# Patient Record
Sex: Female | Born: 1980 | Hispanic: Yes | Marital: Single | Smoking: Never smoker
Health system: Southern US, Community
[De-identification: ages and names within clinical notes are randomized; demographics above are authoritative.]

## PROBLEM LIST (undated history)

## (undated) DIAGNOSIS — E119 Type 2 diabetes mellitus without complications: Secondary | ICD-10-CM

## (undated) HISTORY — DX: Type 2 diabetes mellitus without complications: E11.9

---

## 2012-06-07 HISTORY — PX: CARPAL TUNNEL RELEASE: SHX101

## 2013-06-07 NOTE — L&D Delivery Note (Signed)
Delivery Note CNM called for delivery (phone time 10:03); upon arrival, infant on pt's abd and cord being cut. At 10:03 PM a viable female was delivered via Vaginal, Spontaneous Delivery (Presentation: Left Occiput Anterior).  APGAR: 9, 9; weight: pending. Bladder straight cathed due to pt's desire to urinate just prior to delivery- urine quantity sufficient.  Placenta status: Intact, Spontaneous.  Cord: 3 vessels with the following complications: None.  Fundus firm with massage.  Anesthesia: Epidural  Episiotomy: None Lacerations: None Est. Blood Loss (mL): 300  Mom to postpartum.  Baby to Couplet care / Skin to Skin.  Cam HaiSHAW, Oyuki Hogan CNM 12/09/2013, 10:50 PM

## 2013-06-07 NOTE — L&D Delivery Note (Signed)
Attestation of Attending Supervision of Advanced Practitioner (CNM/NP): Evaluation and management procedures were performed by the Advanced Practitioner under my supervision and collaboration.  I have reviewed the Advanced Practitioner's note and chart, and I agree with the management and plan.  Demere Dotzler 12/13/2013 2:29 PM   

## 2013-07-02 LAB — OB RESULTS CONSOLE GC/CHLAMYDIA
Chlamydia: NEGATIVE
Gonorrhea: NEGATIVE

## 2013-07-02 LAB — OB RESULTS CONSOLE RPR: RPR: NONREACTIVE

## 2013-07-02 LAB — OB RESULTS CONSOLE ABO/RH: RH Type: POSITIVE

## 2013-07-02 LAB — OB RESULTS CONSOLE ANTIBODY SCREEN: Antibody Screen: NEGATIVE

## 2013-07-02 LAB — OB RESULTS CONSOLE VARICELLA ZOSTER ANTIBODY, IGG: Varicella: IMMUNE

## 2013-07-02 LAB — OB RESULTS CONSOLE PLATELET COUNT: PLATELETS: 294 10*3/uL

## 2013-07-02 LAB — OB RESULTS CONSOLE HGB/HCT, BLOOD
HCT: 40 %
Hemoglobin: 13.1 g/dL

## 2013-07-02 LAB — GLUCOSE TOLERANCE, 1 HOUR: GLUCOSE 1 HOUR GTT: 195

## 2013-07-09 ENCOUNTER — Encounter: Payer: Self-pay | Admitting: *Deleted

## 2013-07-23 ENCOUNTER — Ambulatory Visit (INDEPENDENT_AMBULATORY_CARE_PROVIDER_SITE_OTHER): Payer: Self-pay | Admitting: Obstetrics & Gynecology

## 2013-07-23 ENCOUNTER — Encounter: Payer: Medicaid Other | Attending: Family Medicine | Admitting: *Deleted

## 2013-07-23 VITALS — BP 123/77 | Temp 98.2°F | Ht 60.5 in | Wt 160.5 lb

## 2013-07-23 DIAGNOSIS — O24419 Gestational diabetes mellitus in pregnancy, unspecified control: Secondary | ICD-10-CM | POA: Insufficient documentation

## 2013-07-23 DIAGNOSIS — O99810 Abnormal glucose complicating pregnancy: Secondary | ICD-10-CM

## 2013-07-23 LAB — POCT URINALYSIS DIP (DEVICE)
Bilirubin Urine: NEGATIVE
GLUCOSE, UA: NEGATIVE mg/dL
Hgb urine dipstick: NEGATIVE
Leukocytes, UA: NEGATIVE
Nitrite: NEGATIVE
PH: 7 (ref 5.0–8.0)
Protein, ur: NEGATIVE mg/dL
Specific Gravity, Urine: 1.01 (ref 1.005–1.030)
Urobilinogen, UA: 0.2 mg/dL (ref 0.0–1.0)

## 2013-07-23 NOTE — Progress Notes (Signed)
  Patient was seen for Gestational Diabetes self-management .    States the definition of Gestational Diabetes  States when to check blood glucose levels  Demonstrates proper blood glucose monitoring techniques  States the effect of stress and exercise on blood glucose levels  Consider  increasing your activity level by walking daily as tolerated Begin checking BG before breakfast and 2 hours after first bit of breakfast, lunch and dinner after  as directed by MD   Patient instructed to monitor glucose levels: FBS: 60 - <90 2 hour: <120  Patient received the following handouts:  Nutrition Diabetes and Pregnancy  Patient will be seen for follow-up as needed.

## 2013-07-23 NOTE — Progress Notes (Signed)
Pulse: 78 Patient failed 1 hr gtt with 195 result. Today she will see Diabetes Ed, Nutrition and SW. She will return in one week for MD appt.

## 2013-07-23 NOTE — Progress Notes (Signed)
Nutrition note: 1st visit consult & GDM diet education Pt has h/o obesity.  Pt has lost 4.5# @ 19w. Pt is unsure of reason for wt loss. Pt reports eating 3 meals & 3 snacks/d. Pt is taking PNV. Pt reports no N/V or heartburn. Pt received verbal & written education in Spanish about GDM diet. Encouraged protein with all meals & snacks. Discussed wt gain goals of 11-20# or 0.5#/wk. Pt agrees to follow GDM diet with 3 meals & 3 snacks/d & proper CHO/ protein combination. Pt has WIC & plans to BF. F/u in 2-4 wks Blondell RevealLaura Akhilesh Sassone, MS, RD, LDN, [facility]

## 2013-07-26 LAB — CULTURE, OB URINE

## 2013-07-27 LAB — PRESCRIPTION MONITORING PROFILE (19 PANEL)
Amphetamine/Meth: NEGATIVE ng/mL
BUPRENORPHINE, URINE: NEGATIVE ng/mL
Barbiturate Screen, Urine: NEGATIVE ng/mL
Benzodiazepine Screen, Urine: NEGATIVE ng/mL
COCAINE METABOLITES: NEGATIVE ng/mL
Cannabinoid Scrn, Ur: NEGATIVE ng/mL
Carisoprodol, Urine: NEGATIVE ng/mL
Creatinine, Urine: 57.24 mg/dL (ref >20.0)
FENTANYL URINE: NEGATIVE ng/mL
MDMA URINE: NEGATIVE ng/mL
Meperidine, Ur: NEGATIVE ng/mL
Methadone Screen, Urine: NEGATIVE ng/mL
Methaqualone: NEGATIVE ng/mL
Nitrites, Initial: NEGATIVE ug/mL
Opiate Screen, Urine: NEGATIVE ng/mL
Oxycodone Screen, Ur: NEGATIVE ng/mL
Phencyclidine, Ur: NEGATIVE ng/mL
Propoxyphene: NEGATIVE ng/mL
Tapentadol, urine: NEGATIVE ng/mL
Tramadol Scrn, Ur: NEGATIVE ng/mL
ZOLPIDEM, URINE: NEGATIVE ng/mL
pH, Initial: 7.2 pH (ref 4.5–8.9)

## 2013-07-30 ENCOUNTER — Ambulatory Visit (INDEPENDENT_AMBULATORY_CARE_PROVIDER_SITE_OTHER): Payer: Self-pay | Admitting: Family Medicine

## 2013-07-30 ENCOUNTER — Ambulatory Visit (INDEPENDENT_AMBULATORY_CARE_PROVIDER_SITE_OTHER): Payer: Self-pay | Admitting: Home Health Services

## 2013-07-30 ENCOUNTER — Encounter: Payer: Self-pay | Admitting: Family Medicine

## 2013-07-30 VITALS — BP 136/85 | Temp 97.6°F | Wt 158.9 lb

## 2013-07-30 DIAGNOSIS — O09899 Supervision of other high risk pregnancies, unspecified trimester: Secondary | ICD-10-CM | POA: Insufficient documentation

## 2013-07-30 DIAGNOSIS — O9989 Other specified diseases and conditions complicating pregnancy, childbirth and the puerperium: Secondary | ICD-10-CM

## 2013-07-30 DIAGNOSIS — O99810 Abnormal glucose complicating pregnancy: Secondary | ICD-10-CM

## 2013-07-30 DIAGNOSIS — O24419 Gestational diabetes mellitus in pregnancy, unspecified control: Secondary | ICD-10-CM

## 2013-07-30 DIAGNOSIS — Z283 Underimmunization status: Secondary | ICD-10-CM

## 2013-07-30 DIAGNOSIS — Z2839 Other underimmunization status: Secondary | ICD-10-CM

## 2013-07-30 LAB — POCT URINALYSIS DIP (DEVICE)
Bilirubin Urine: NEGATIVE
Glucose, UA: NEGATIVE mg/dL
HGB URINE DIPSTICK: NEGATIVE
KETONES UR: 15 mg/dL — AB
Leukocytes, UA: NEGATIVE
Nitrite: NEGATIVE
PROTEIN: NEGATIVE mg/dL
Specific Gravity, Urine: 1.01 (ref 1.005–1.030)
UROBILINOGEN UA: 1 mg/dL (ref 0.0–1.0)
pH: 7 (ref 5.0–8.0)

## 2013-07-30 MED ORDER — ASPIRIN 81 MG PO CHEW: 81.0000 mg | CHEWABLE_TABLET | Freq: Every day | ORAL | Status: DC

## 2013-07-30 NOTE — Progress Notes (Signed)
[facility] 1 hour 195 at 16 wks, begin baby ASA Had quad there-need results FBS 76-103 (3 out of range) 2 hour pp 77-126 (1 out of range Needs baseline labs and 24 hr urine.  Anatomy u/s fetal ECHO and optho Begin baby ASA

## 2013-07-30 NOTE — Progress Notes (Signed)
Pulse- 75 Pt reports still not having any baby mvmt New ob packet given Flu vaccine give @ [facility]

## 2013-07-30 NOTE — Patient Instructions (Addendum)
A raz de una dieta [place] y Radio producer nivel de azcar en la sangre bajo control es la cosa ms importante que hacer para su salud y la de su beb por Psychologist, clinical.  Por favor verifique su nivel de azcar en la sangre 4 veces al da.  Por favor, mantenga registros BS precisos y llevarlos con usted a cada visita. Por favor traiga su medidor tambin.  Las metas para el azcar en sangre deben ser: 1. El ayuno (a primera hora de la maana antes de comer) debe ser inferior a 90. 2. 2 horas despus de las comidas deben ser inferiores a 120.  Por favor, comer 3 comidas y 3 meriendas. Incluya protenas (carne, lcteos, queso, huevos, frutos secos) con todas las comidas.  Tenga en cuenta que los carbohidratos aumentan el nivel de azcar en la sangre. No slo la comida dulce (galletas, pasteles, rosquillas, fruta, jugo, soda), sino tambin el pan, pasta, arroz y patatas. Usted tiene que limitar la cantidad de carbohidratos que est comiendo.  Adicin de ejercicio, tan slo 30 minutos al da puede disminuir el nivel de Banker.   Diabetes mellitus gestacional  (Gestational Diabetes Mellitus) La diabetes mellitus gestacional, ms comnmente conocida como diabetes gestacional es un tipo de diabetes que desarrollan algunas mujeres durante el [place]. En la diabetes gestacional, el pncreas no produce suficiente insulina (una hormona), las clulas son menos sensibles a la insulina que se produce (resistencia a la insulina), o ambos.Normalmente, la Johnson [employer] azcares de los alimentos a las clulas de los tejidos. Las clulas de los tejidos Cendant Corporation azcares para Psychiatrist. La falta de insulina o la falta de una respuesta normal a la insulina hace que el exceso de azcar se acumule en la sangre en lugar de Customer service manager en las clulas de los tejidos. Como resultado, se desarrollan los niveles altos de Banker (hiperglucemia). El [address] de los niveles altos de azcar (glucosa)  puede causar muchas complicaciones.  FACTORES DE RIESGO Usted tiene mayor probabilidad de desarrollar diabetes gestacional si tiene antecedentes familiares de diabetes y tambin si tiene uno o ms de los siguientes factores de riesgo:   ndice de masa corporal superior a 30 (obesidad).  Embarazo previo con diabetes gestacional.  Mayor edad en el momento del [place]. Si se mantienen los niveles de Event organiser en un rango normal durante el [place], las mujeres pueden tener un embarazo saludable. Si no controla bien sus niveles de glucosa en sangre, pueden tener tener riesgos usted, su beb antes de nacer (feto), el [place] de parto y [place], o el beb recin nacido.  SNTOMAS  Si se presentan sntomas, stos son similares a los sntomas que normalmente experimentar durante el embarazo. Los sntomas de la diabetes gestacional son:   Wyvonnia Dusky de la sed (polidipsia).  Aumento de ganas de Geographical information systems officer (poliuria).  Aumento de ganas de orinar durante la noche (nicturia).  Prdida de peso. Prdida de Celanese Corporation puede ser muy rpida.  Infecciones frecuentes y recurrentes.  Cansancio (fatiga).  Debilidad.  Cambios en la visin, como visin borrosa.  Olor a Water quality scientist.  Dolor abdominal. DIAGNSTICO La diabetes se diagnostica cuando hay aumento de los niveles de glucosa en la [place]. El nivel de glucosa en la sangre puede controlarse en uno o ms de los siguientes anlisis de sangre:   Medicin de glucosa en sangre en ayunas. No deber comer durante al menos 8 horas antes de que se tome la [place]  de [place].  Anlisis al azar de glucosa en sangre. El nivel de glucosa en sangre se controla en cualquier momento del da sin importar el momento en que haya comido.  Pruebas de glucosa de sangre de hemoglobina A1c. Un anlisis de la hemoglobina A1c proporciona informacin sobre el control de la glucosa en la sangre durante los ltimos 3 meses.  Prueba oral de tolerancia a la glucosa  (SOG). La glucosa en la sangre se mide despus de no haber comido (ayuno) durante 1-3 horas y despus de beber una bebida que contiene glucosa. Dado que las hormonas que causan la resistencia a la insulina son ms altas alrededor de las semanas 24-28 de [place], generalmente se realiza un SOG durante ese [place]. Si tiene factores de riesgo para la diabetes gestacional, su mdico puede detectarla antes de las 24 semanas de [place]. TRATAMIENTO   Usted tendr que tomar medicamentos para la diabetes o insulina diariamente para Pharmacologist los niveles de glucosa en sangre en el rango deseado.  Usted tendr Gap Inc coincidir la dosis de insulina con el ejercicio y Soil scientist de alimentos saludables. El objetivo del tratamiento es mantener el nivel de glucosa en sangre en 60-99 mg / dl antes de la comida (preprandial), a la hora de acostarse y Fiserv noche mientras dure su Psychiatrist. El objetivo del tratamiento es mantener el mayor pico de International aid/development worker en sangre despus de la comida (glucosa postprandial) en 100-140 mg/dl.  INSTRUCCIONES PARA EL CUIDADO EN EL HOGAR   Haga que su nivel de hemoglobina A1c sea verificado dos veces al ao.  Realice un control diario de glucosa en sangre segn las indicaciones de su mdico. Es comn realizar controles con frecuencia de la glucosa en sangre.  Supervise las cetonas en la orina cuando est enferma y segn las indicaciones de su mdico.  Tome su medicamento para la diabetes y la insulina segn las indicaciones de su mdico para Pharmacologist el nivel de glucosa en sangre en su rango deseado.  Nunca se quede sin medicamentos para la diabetes o sin insulina. Es necesario recibirla [employer].  Ajuste la Jacobs Engineering base de la ingesta de hidratos de carbono. Los hidratos de carbono pueden aumentar los niveles de glucosa en la sangre, pero deben incluirse en su dieta. Los hidratos de carbono aportan vitaminas, minerales y [place] que son Neomia Dear parte esencial de una  dieta saludable. Los hidratos de carbono se encuentran en frutas, verduras, granos enteros, productos lcteos, legumbres y alimentos que contienen azcares aadidos.    Consuma alimentos saludables. Alterne 3 comidas con 3 colaciones.  Mantenga un aumento de peso saludable. El aumento del peso total vara de acuerdo con el ndice de masa corporal antes del embarazo [facility]).  Lleve una tarjeta de alerta mdica o lleve una pulsera de alerta mdica.  Lleve consigo un refrigerio de 15 gramos de hidrato de carbonos en todo momento para Chief Operating Officer los niveles bajos de glucosa en sangre (hipoglucemia). Algunos ejemplos de aperitivos de 15 gramos de hidratos de carbono son:  Tabletas de glucosa, 3 o 4   Gel de glucosa, tubo de 15 gramos  Pasas de uva, 2 cucharadas (24 g)  Caramelos de goma, 6  Galletas de [place], 8  Jugo de fruta, soda regular, o leche baja en grasa, 4 onzas (120 ml)  Pastillas gomitas, 9    Reconocer la hipoglucemia. Durante el embarazo la hipoglucemia se produce cuando hay niveles de glucosa en sangre de 60 mg/dl o menos. El [place]  de hipoglucemia aumenta durante el ayuno o saltarse las comidas, [address] o despus del ejercicio intenso, y [place] sueo. Los sntomas de hipoglucemia son:  Temblores o sacudidas.  Disminucin de capacidad de concentracin.  Sudoracin.  Aumento en el ritmo cardaco  Dolor de [place].  [employer].  Hambre.  Irritabilidad.  Ansiedad.  Sueo agitado.  Alteracin del habla o de la coordinacin.  Confusin.  Tratar la hipoglucemia rpidamente. Si usted est alerta y puede tragar con seguridad, siga la regla de 15:15:  Tome de 15 a 20 gramos de glucosa de accin rpida o hidratos de carbono . Las opciones de accin rpida son un gel de glucosa, unas tabletas de glucosa, o 4 onzas (120 ml) de jugo de frutas, soda regular, o leche baja en grasa.  Compruebe su nivel de glucosa en sangre 15 minutos despus de tomar la  glucosa.   Tome de 15 a 20 gramos ms de glucosa si al repetir el nivel de glucosa en la sangre todava es de 70 mg / dl o inferior.  Coma una comida o una colacin dentro de 1 hora una vez que los niveles de glucosa en la sangre vuelven a la normalidad.  Est alerta a la poliuria y polidipsia, que son los primeros signos de hiperglucemia. El conocimiento temprano de la hiperglucemia permite un tratamiento oportuno. Controle la hiperglucemia segn le indic su mdico.  Haga actividad fsica por lo menos 30 minutos al da o como lo indique su mdico. Se recomienda diez minutos de actividad fsica cronometrados 30 minutos despus de cada comida para Chief Operating Officer los niveles de glucosa en sangre postprandial.  Ajuste su dosis de insulina y la ingesta de alimentos, segn sea necesario, si se inicia un nuevo ejercicio o deporte.  Siga su plan diario de enfermo en algn momento que no puede comer o beber como de costumbre.  Evite el tabaco y el alcohol.  Concurra regularmente a las visitas de control con el mdico.  Siga el consejo de su mdico respecto a los controles prenatales y posteriores al parto (post-parto), a la planificacin de las comidas, al ejercicio, a los [address], a las vitaminas, a los anlisis de [place], a otras pruebas mdicas y fsicas.  Cuide diariamente la piel y los pies. Examine su piel y los pies diariamente para detectar cortes, moretones, enrojecimiento, problemas en las uas, sangrado, ampollas o llagas.  Cepllese los dientes y encas por lo menos dos veces al da y use hilo dental al menos una vez por da. Concurra regularmente a las visitas de control con el dentista.  Programe un examen de vista durante el primer trimestre de su embarazo o como lo indique su mdico.  Comparta su plan de control de diabetes en su trabajo o en la escuela.  Mantngase al da con las vacunas.  Aprenda a Dealer.  Obtenga educacin continuada y ayuda para la diabetes  cuando sea necesario. SOLICITE ATENCIN MDICA SI:   No puede comer alimentos o beber por ms de 6 horas.  Tiene nuseas o ha vomitado durante ms de 6 horas.  Tiene un nivel de glucosa en sangre de 200 mg/dl y [employer].  Presenta algn cambio en el estado mental.  Tiene problemas de visin.  Sufre un dolor persistente de Training and development officer.  Tiene dolor o molestias en el abdomen.  Desarrolla una enfermedad grave adicional.  Tiene diarrea durante ms de 6 horas.  Ha estado enferma o ha tenido fiebre durante un par [employer]  y no mejora. SOLICITE ATENCIN MDICA DE INMEDIATO SI:   Tiene dificultad para respirar.  Ya no siente los movimientos del beb.  Est sangrando o tiene flujo vaginal.  Comienza a tener contracciones o trabajo de [place]parto prematuro. ASEGRESE DE QUE:  Comprende estas instrucciones.  Controlar su enfermedad.  Solicitar ayuda de inmediato si no mejora o si empeora. Document Released: 03/03/2005 Document Revised: 02/16/2012 [facility] Patient Information 2014 Republican CityExitCare, MarylandLLC.  Lactancia materna (Breastfeeding) Decidir Museum/gallery exhibitions officeramamantar es una de las mejores elecciones que puede hacer por usted y su beb. El cambio hormonal durante el Psychiatristembarazo produce el desarrollo del tejido mamario y Lesothoaumenta la cantidad y el tamao de los conductos galactforos. Estas hormonas tambin permiten que las protenas, los azcares y las grasas de la sangre produzcan la WPS Resourcesleche materna en las glndulas productoras de Slaterleche. Las hormonas impiden que la leche materna sea liberada antes del nacimiento del beb, adems de impulsar el flujo de leche luego del nacimiento. Una vez que ha comenzado a Museum/gallery exhibitions officeramamantar, Conservation officer, naturepensar en el beb, as Immunologistcomo la succin o Theatre managerel llanto, pueden estimular la liberacin de Powhatanleche de las glndulas productoras de [place]leche.  LOS BENEFICIOS DE AMAMANTAR Para el beb  La primera leche (calostro) ayuda al mejor funcionamiento del sistema digestivo del beb.  La leche tiene anticuerpos  que ayudan a Radio producerprevenir las infecciones en el beb.  El beb tiene una menor incidencia de asma, alergias y del sndrome de muerte sbita del lactante.  Los nutrientes en la Deweyvilleleche materna son mejores para el beb que la Snellingleche maternizada y estn preparados exclusivamente para cubrir las necesidades del beb.  La leche materna mejora el desarrollo cerebral del beb.  Es menos probable que el beb desarrolle otras enfermedades, como obesidad infantil, asma o diabetes mellitus de tipo 2. Para usted   La lactancia materna favorece el desarrollo de un vnculo muy especial entre la madre y el beb.  Es conveniente. Siempre est disponible a la temperatura correcta y es Plymoutheconmica.  La lactancia materna ayuda a quemar caloras y a perder el peso ganado durante el Crookembarazo.  Favorece la contraccin del tero al tamao que tena antes del embarazo de manera ms rpida y disminuye el sangrado (loquios) despus del parto.  La lactancia materna contribuye a reducir Nurse, adultel riesgo de desarrollar diabetes mellitus de tipo 2, osteoporosis o cncer de mama o de ovario en el futuro. SIGNOS DE QUE EL BEB EST HAMBRIENTO Primeros signos de [address]  Aumenta su estado de Lesothoalerta o actividad.  Se estira.  Mueve la cabeza de un lado a otro.  Mueve la cabeza y abre la boca cuando se le toca la mejilla o la comisura de la boca (reflejo de bsqueda).  Aumenta las vocalizaciones, tales como sonidos de succin, se relame los labios, emite arrullos, suspiros, o chirridos.  Mueve la Jones Apparel Groupmano hacia la boca.  Se chupa con ganas los dedos o las manos. Signos tardos de Fisher Scientifichambre  Est agitado.  Llora de manera intermitente. Signos de AES Corporationhambre extrema Los signos de hambre extrema requerirn que lo calme y lo consuele antes de que el beb pueda alimentarse adecuadamente. No espere a que se manifiesten los siguientes signos de hambre extrema para comenzar a Museum/gallery exhibitions officeramamantar:   Designer, jewelleryAgitacin.  Llanto intenso y fuerte.    Gritos. INFORMACIN BSICA SOBRE LA LACTANCIA MATERNA Iniciacin de la lactancia materna  Encuentre un lugar cmodo para sentarse o acostarse, con un buen respaldo para el cuello y la espalda.  Coloque una almohada o [place] Hilluna manta  enrollada debajo del beb para acomodarlo a la altura de la mama (si est sentada). Las almohadas para Museum/gallery exhibitions officer se han diseado especialmente a fin de servir de apoyo para los brazos y el beb [employer].  Asegrese de que el abdomen del beb est frente al suyo.  Masajee suavemente la mama. Con las yemas de los dedos, masajee la pared del pecho hacia el pezn en un movimiento circular. Esto estimula el flujo de [place]. Es posible que Engineer, manufacturing systems este movimiento mientras amamanta si la leche fluye lentamente.  Sostenga la mama con el pulgar por arriba del pezn y los otros 4 dedos por debajo de la mama. Asegrese de que los dedos se encuentren lejos del pezn y de la boca del beb.  Empuje suavemente los labios del beb con el pezn o con el dedo.  Cuando la boca del beb se abra lo suficiente, acrquelo rpidamente a la mama e introduzca todo el pezn y la zona oscura que lo rodea (areola), tanto como sea posible, dentro de la boca del beb.  Debe haber ms areola visible por arriba del labio superior del beb que por debajo del labio inferior.  La lengua del beb debe estar entre la enca inferior y la [place].  Asegrese de que la boca del beb est en la posicin correcta alrededor del pezn (prendida). Los labios del beb deben crear un sello sobre la mama, doblndose hacia afuera (invertidos).  Es comn que el beb succione durante 2 a 3 minutos para que comience el flujo de [place]. Cmo debe prenderse Es muy importante que le ensee al beb cmo prenderse adecuadamente a la mama. Si el beb no se prende adecuadamente, puede causarle dolor en el pezn y reducir la produccin de [place], y hacer que el beb tenga un escaso aumento de [place].  Adems, si el beb no se prende adecuadamente al pezn, puede tragar aire durante la alimentacin. Esto puede causarle molestias al beb. Hacer eructar al beb al Pilar Plate de mama puede ayudarlo a liberar el aire. Sin embargo, ensearle al beb cmo prenderse a la mama adecuadamente es la mejor manera de evitar que se sienta molesto por tragar Oceanographer se alimenta. Signos de que el beb se ha prendido adecuadamente al pezn:   Payton Doughty o succiona de modo silencioso, sin causarle dolor.  Se escucha que traga cada 3 o 4 succiones.   Hay movimientos musculares por arriba y por delante de sus odos al Printmaker. Signos de que el beb no se ha prendido Audiological scientist al pezn:   Hace ruidos de succin o de chasquido mientras se alimenta.  Dolor en el pezn. Si cree que el beb no se prendi correctamente, deslice el dedo en la comisura de la boca y Ameren Corporation las encas del beb para interrumpir la succin. Intente comenzar a amamantar nuevamente. Signos de Fish farm manager Signos del beb:   Disminucin gradual en el nmero de succiones o cese completo de la succin.  Se duerme.  Relaja el cuerpo.  Retiene una pequea cantidad de Avalon en su boca.  Se desprende solo del pecho. Signos que presenta usted:  Las mamas han aumentado la firmeza, el peso y el tamao 1 a 3 horas despus de Museum/gallery exhibitions officer.  Estn ms blandas inmediatamente despus de amamantar.  Un aumento del volumen de [place], y tambin el cambio de su consistencia y color se producen hacia el quinto da de Tour manager.  Los pezones no duelen, ni estn agrietados ni sangran.  Signos de que su beb recibe la cantidad de leche suficiente  Moja al menos 3 paales en 24 horas. La orina debe ser clara y de color amarillo plido a los [address] de [place].  Defeca al menos 3 veces en 24 horas a los 5 [address] de [address] La materia fecal debe ser blanda y [place].  Defeca al menos 3 veces en 24 horas a los [address] de  [address] La materia fecal debe ser grumosa y [place].  No registra una prdida de peso mayor del 10% del peso al nacer durante los primeros 3 [address] de [place].  Aumenta de peso un promedio de 4 a 7onzas (120 a ) por semana despus de los [address] de vida.  Aumenta de [place], [place], de Frannie consistente a Glass blower/designer de los 5 [address] de vida, sin Passenger transport manager prdida de peso despus de las 2 semanas de vida. Despus de alimentarse, es posible que el beb regurgite una pequea cantidad. Esto es frecuente. FRECUENCIA Y DURACIN DE LA LACTANCIA MATERNA El amamantamiento frecuente la ayudar a producir ms [place] y a Education officer, community de Engineer, mining en los pezones e hinchazn en las [place]. Alimente al beb cuando muestre signos de hambre o si siente la necesidad de reducir la congestin de las [place]. Esto se denomina "lactancia a demanda". Evite el uso del chupete mientras trabaja para establecer la lactancia (las primeras 4 a 6 semanas despus del nacimiento del beb). Despus de este perodo, podr ofrecerle un chupete. Las investigaciones demostraron que el uso del chupete durante el primer ao de vida del beb disminuye el riesgo de desarrollar el sndrome de muerte sbita del lactante (SMSL). Permita que el nio se alimente en cada mama todo lo que desee. Contine amamantando al beb hasta que haya terminado de alimentarse. Cuando el beb se desprende o se queda dormido mientras se est alimentando de la primera mama, ofrzcale la segunda. Debido a que, con frecuencia, los recin [place] las primeras semanas de vida, es posible que deba despertar a su beb para alimentarlo. Los horarios de Acupuncturist de un beb a otro. Sin embargo, las siguientes reglas pueden servir como gua para ayudarle a Lawyer que el beb se alimenta adecuadamente:  Se puede amamantar a los recin nacidos (bebs de 4 semanas o menos de vida) cada 1 a 3 horas.  No deben transcurrir ms de 3 horas durante  el da o 5 horas durante la noche sin que se amamante a los recin nacidos.  Debe amamantar al beb 8 veces como mnimo, en un perodo de 24 horas, hasta que comience a introducir slidos en su dieta, a los 6 meses de vida aproximadamente. EXTRACCIN DE [employer] Company MATERNA La extraccin y Contractor de la leche materna le permiten asegurarse de que el beb se alimente exclusivamente de [place], aun en momentos en los que no puede amamantar. Esto tiene especial importancia si debe regresar al Aleen Campi en el perodo en que an est amamantando o si no puede estar presente en los momentos en que el beb debe alimentarse. Su asesor en lactancia puede orientarla sobre cunto tiempo es seguro almacenar [place].  El sacaleche es un aparato que le permite extraer leche de la mama a un recipiente estril. Luego, la leche materna extrada puede almacenarse en un refrigerador o freezer. Algunos sacaleches son Birdie Riddle, Delaney Meigs otros son elctricos. Consulte a su asesor en lactancia qu tipo ser ms conveniente para usted. Los sacaleches se pueden comprar, sin embargo, algunos  hospitales y grupos de apoyo a la lactancia materna alquilan Sports coach. Un asesor en lactancia puede ensearle cmo extraer [employer], en caso de que prefiera no usar un sacaleche.  CMO CUIDAR LAS MAMAS DURANTE LA LACTANCIA MATERNA Los pezones se secan, agrietan y duelen durante la Tour manager. Las siguientes recomendaciones pueden ayudarle a Pharmacologist las [employer]PP[employer] Partners y sanas:  Careers information officer usar jabn en los pezones.  Use un sostn de soporte. Aunque no son esenciales, las camisetas sin mangas o los sostenes especiales para Museum/gallery exhibitions officer estn diseados para acceder fcilmente a las mamas, para Museum/gallery exhibitions officer sin tener que quitarse todo el sostn o la camiseta. Evite usar sostenes con aro o sostenes muy ajustados.  Seque al aire sus pezones durante 3 a despus de amamantar al  beb.  Utilice solo apsitos de Haematologist sostn para Environmental health practitioner las prdidas de [place]. La prdida de un poco de Public Service Enterprise Group tomas es normal.  Utilice lanolina sobre los pezones luego de Museum/gallery exhibitions officer. La lanolina ayuda a mantener la humedad normal de la piel. Si [place] lanolina pura, no tiene que lavarse los pezones antes de Corporate treasurer al beb. La lanolina pura no es txica para el beb. Adems, puede extraer [employer] Homes algunas gotas de [place] materna y Engineer, maintenance (IT) suavemente esa [employer], para que la [place] se seque al aire. Durante las primeras semanas despus de dar a luz, algunas mujeres pueden experimentar hinchazn en las mamas (congestin [place]). La congestin puede hacer que sienta las mamas pesadas, calientes y sensibles al tacto. El pico de la congestin ocurre dentro de los 3 a 5 das despus del [place]. Las siguientes recomendaciones pueden ayudarle a Paramedic la congestin:  Vace por completo las mamas al [employer]COMM o Environmental health practitioner. Puede aplicar calor hmedo en las mamas (en la ducha o con toallas hmedas para manos) antes de Museum/gallery exhibitions officer o extraer W[employer] Resources. Esto aumenta la circulacin y [place] a que la [place]. Si el beb no vaca por completo las [address] cuando lo [address], extraiga la [place] restante despus de que haya finalizado.  Use un sostn ajustado (para amamantar o comn) o camiseta sin mangas durante 1 o 2 das para indicar al cuerpo que disminuya ligeramente la produccin de [place].  Aplique compresas de hielo Yahoo[employer] Inc, a menos que le resulte demasiado incmodo.  Asegrese de que el beb se encuentre en la posicin correcta mientras lo alimenta. Si la congestin persiste luego de 48 horas o despus de seguir estas recomendaciones, comunquese con su mdico o un Holiday representative. RECOMENDACIONES GENERALES PARA EL CUIDADO DE LA SALUD DURANTE LA LACTANCIA MATERNA  Consuma alimentos saludables. Alterne comidas y colaciones, comiendo 3 de cada Agricultural engineer.  Dado que lo que come Danaher Corporation, es posible que algunas comidas hagan que su beb se vuelva ms irritable de lo habitual. Evite comer este tipo de alimentos, si percibe que afectan de manera negativa al beb.  Beba leche, jugos de fruta y agua para Patent examiner su sed (aproximadamente 10 vasos al Futures trader).  Descanse con frecuencia, reljese y tome sus vitaminas prenatales para evitar la fatiga, el estrs y la anemia.  Contine con los autocontroles de la mama.  Evite masticar y fumar tabaco.  Evite el consumo de alcohol y drogas. Algunos medicamentos, que pueden ser perjudiciales para el beb, pueden pasar a travs de la [employer]. Es importante que consulte a su mdico antes de Medical sales representative, incluidos todos los medicamentos recetados y de  venta libre, as como los suplementos vitamnicos y herbales. Puede quedar embarazada durante la lactancia. Si desea controlar la natalidad, consulte a su mdico cules son las opciones ms seguras para el beb. SOLICITE ATENCIN MDICA SI:   Usted siente que quiere dejar de Museum/gallery exhibitions officer o se siente frustrada con la lactancia.  Siente dolor en las mamas o en los pezones.  Sus pezones estn agrietados o Water quality scientist.  Sus pechos estn irritados, sensibles o calientes.  Tiene un rea hinchada en cualquiera de las mamas.  Siente escalofros o fiebre.  Tiene nuseas o vmitos.  Presenta una secrecin de otro lquido distinto de la leche materna de los pezones.  Sus mamas no se llenan antes de Museum/gallery exhibitions officer al beb para el 5. da despus del parto.  Se siente triste y deprimida.  El beb est demasiado somnoliento como para comer bien.  El beb tiene problemas para dormir.  Moja menos de 3 paales en 24 horas.  Defeca menos de 3 veces en 24 horas.  La piel del beb o la parte blanca de sus ojos est amarilla.  El beb no ha aumentado de [place] a los [address] de [place]. SOLICITE ATENCIN MDICA DE INMEDIATO SI:   El beb est muy  cansado (aletargado) y no se despierta para comer.  Le sube la fiebre sin causa. Document Released: 05/24/2005 Document Revised: 09/18/2012 [facility] Patient Information 2014 [place], [place].

## 2013-07-30 NOTE — Progress Notes (Signed)
U/S scheduled 08/07/13 at 730 am. Fetal Echo scheduled 08/23/13 at 830 am with Dr. Wendie Chessotton.(patient advised to bring $ 100 to visit and to call the telephone # for financial assistance.)  Retinal scan Scheduled with [facility] today at 130 pm.

## 2013-07-30 NOTE — Progress Notes (Signed)
DIABETES Pt came in to have a retinal scan per diabetic care.   Image was taken and submitted to [facility] Garg for reading.    Results will be available in 1-2 weeks.  Results will be given to PCP for review and to contact patient.  Sharlena Kristensen  

## 2013-08-01 ENCOUNTER — Other Ambulatory Visit: Payer: Self-pay | Admitting: *Deleted

## 2013-08-01 ENCOUNTER — Other Ambulatory Visit: Payer: Medicaid Other

## 2013-08-01 DIAGNOSIS — O24419 Gestational diabetes mellitus in pregnancy, unspecified control: Secondary | ICD-10-CM

## 2013-08-01 DIAGNOSIS — Z283 Underimmunization status: Secondary | ICD-10-CM

## 2013-08-01 DIAGNOSIS — O9989 Other specified diseases and conditions complicating pregnancy, childbirth and the puerperium: Secondary | ICD-10-CM

## 2013-08-01 DIAGNOSIS — Z2839 Other underimmunization status: Secondary | ICD-10-CM

## 2013-08-01 DIAGNOSIS — O162 Unspecified maternal hypertension, second trimester: Secondary | ICD-10-CM

## 2013-08-01 LAB — COMPREHENSIVE METABOLIC PANEL
ALT: 44 U/L — AB (ref 0–35)
AST: 36 U/L (ref 0–37)
Albumin: 4 g/dL (ref 3.5–5.2)
Alkaline Phosphatase: 69 U/L (ref 39–117)
BILIRUBIN TOTAL: 0.4 mg/dL (ref 0.2–1.2)
BUN: 6 mg/dL (ref 6–23)
CO2: 21 mEq/L (ref 19–32)
CREATININE: 0.43 mg/dL — AB (ref 0.50–1.10)
Calcium: 9.4 mg/dL (ref 8.4–10.5)
Chloride: 107 mEq/L (ref 96–112)
Glucose, Bld: 76 mg/dL (ref 70–99)
Potassium: 4.1 mEq/L (ref 3.5–5.3)
SODIUM: 139 meq/L (ref 135–145)
TOTAL PROTEIN: 6.8 g/dL (ref 6.0–8.3)

## 2013-08-01 LAB — HEMOGLOBIN A1C
Hgb A1c MFr Bld: 5.1 % (ref <5.7)
Mean Plasma Glucose: 100 mg/dL (ref <117)

## 2013-08-01 LAB — TSH: TSH: 0.834 u[IU]/mL (ref 0.350–4.500)

## 2013-08-02 ENCOUNTER — Encounter: Payer: Self-pay | Admitting: Family Medicine

## 2013-08-02 LAB — CREATININE CLEARANCE, URINE, 24 HOUR
CREAT CLEAR: 172 mL/min — AB (ref 75–115)
CREATININE 24H UR: 1065 mg/d (ref 700–1800)
Creatinine, Urine: 39.8 mg/dL
Creatinine: 0.43 mg/dL — ABNORMAL LOW (ref 0.50–1.10)

## 2013-08-02 LAB — PROTEIN, URINE, 24 HOUR: Protein, Urine: <3 mg/dL

## 2013-08-07 ENCOUNTER — Ambulatory Visit (HOSPITAL_COMMUNITY)
Admission: RE | Admit: 2013-08-07 | Discharge: 2013-08-07 | Disposition: A | Discharge status: Home / Self Care | Payer: No Typology Code available for payment source | Source: Ambulatory Visit | Attending: Family Medicine | Admitting: Family Medicine

## 2013-08-07 DIAGNOSIS — O24419 Gestational diabetes mellitus in pregnancy, unspecified control: Secondary | ICD-10-CM

## 2013-08-07 DIAGNOSIS — Z1389 Encounter for screening for other disorder: Secondary | ICD-10-CM | POA: Insufficient documentation

## 2013-08-07 DIAGNOSIS — Z2839 Other underimmunization status: Secondary | ICD-10-CM

## 2013-08-07 DIAGNOSIS — O99810 Abnormal glucose complicating pregnancy: Secondary | ICD-10-CM | POA: Insufficient documentation

## 2013-08-07 DIAGNOSIS — O9989 Other specified diseases and conditions complicating pregnancy, childbirth and the puerperium: Secondary | ICD-10-CM

## 2013-08-07 DIAGNOSIS — O358XX0 Maternal care for other (suspected) fetal abnormality and damage, not applicable or unspecified: Secondary | ICD-10-CM | POA: Insufficient documentation

## 2013-08-07 DIAGNOSIS — Z3490 Encounter for supervision of normal pregnancy, unspecified, unspecified trimester: Secondary | ICD-10-CM

## 2013-08-07 DIAGNOSIS — Z283 Underimmunization status: Secondary | ICD-10-CM

## 2013-08-07 DIAGNOSIS — Z363 Encounter for antenatal screening for malformations: Secondary | ICD-10-CM | POA: Insufficient documentation

## 2013-08-08 ENCOUNTER — Encounter: Payer: Self-pay | Admitting: Family Medicine

## 2013-08-11 ENCOUNTER — Encounter: Payer: Self-pay | Admitting: *Deleted

## 2013-08-13 ENCOUNTER — Ambulatory Visit (INDEPENDENT_AMBULATORY_CARE_PROVIDER_SITE_OTHER): Payer: No Typology Code available for payment source | Admitting: Family Medicine

## 2013-08-13 ENCOUNTER — Encounter: Payer: Self-pay | Admitting: Family Medicine

## 2013-08-13 VITALS — BP 126/80 | Temp 97.6°F | Wt 157.4 lb

## 2013-08-13 DIAGNOSIS — O9989 Other specified diseases and conditions complicating pregnancy, childbirth and the puerperium: Secondary | ICD-10-CM

## 2013-08-13 DIAGNOSIS — Z2839 Encounter for supervision of normal pregnancy, unspecified, unspecified trimester: Secondary | ICD-10-CM

## 2013-08-13 DIAGNOSIS — Z283 Underimmunization status: Secondary | ICD-10-CM

## 2013-08-13 DIAGNOSIS — O09899 Supervision of other high risk pregnancies, unspecified trimester: Secondary | ICD-10-CM

## 2013-08-13 DIAGNOSIS — O24419 Gestational diabetes mellitus in pregnancy, unspecified control: Secondary | ICD-10-CM

## 2013-08-13 DIAGNOSIS — O99810 Abnormal glucose complicating pregnancy: Secondary | ICD-10-CM

## 2013-08-13 LAB — POCT URINALYSIS DIP (DEVICE)
Bilirubin Urine: NEGATIVE
GLUCOSE, UA: NEGATIVE mg/dL
HGB URINE DIPSTICK: NEGATIVE
Ketones, ur: NEGATIVE mg/dL
Leukocytes, UA: NEGATIVE
NITRITE: NEGATIVE
PH: 7 (ref 5.0–8.0)
PROTEIN: NEGATIVE mg/dL
SPECIFIC GRAVITY, URINE: 1.01 (ref 1.005–1.030)
UROBILINOGEN UA: 0.2 mg/dL (ref 0.0–1.0)

## 2013-08-13 MED ORDER — GLYBURIDE 2.5 MG PO TABS: 2.5000 mg | ORAL_TABLET | Freq: Every day | ORAL | Status: DC

## 2013-08-13 NOTE — Progress Notes (Signed)
S: 33 yo [id number] @ [id number] here for ROBV Fasting: 76-109 (over 50% not at goal) 2 hr pp: 79-132 (only one out of goal at 145)  - no ctx, lof, vb. +FM  O: see flowsheet  A/P - DM - fasting uncontrolled.  Start glyburide 2.5mg  qhs - 2 hr pp ok.  - will have her f/u in 2 week for repeat check due to sugars.

## 2013-08-13 NOTE — Progress Notes (Signed)
Pulse- 77 Patient reports occasional back and abdominal pain after doing a lot of chores  Patient also reporting very strong headaches at night, denies blurry vision or dizziness

## 2013-08-13 NOTE — Patient Instructions (Signed)
Segundo trimestre del embarazo  (Second Trimester of Pregnancy) El segundo trimestre del embarazo se extiende desde la semana 13 hasta la semana 28, del 4 al 6 mes. En general, es el momento del embarazo en el que se sentir mejor. Su organismo se ha adaptado a estar embarazada y comienza a sentirse fsicamente mejor. En general las nuseas matutinas han disminuido o han desaparecido completamente. El segundo trimestre es tambin la poca en la que el feto se desarrolla rpidamente. Hacia el final del sexto mes, el beb mide aproximadamente 9 pulgadas (23 cm) y pesa alrededor de 1  libras (700 g). Es probable que sienta mover al beb (dar pataditas) entre las 18 y 20 semanas del embarazo.  CAMBIOS CORPORALES  Su organismo atravesar numerosos cambios durante el embarazo. Los cambios varan de una mujer a otra.   Seguir aumentando de peso. Notar que la parte baja del abdomen se ensancha.  Podrn aparecer las primeras estras en las caderas, abdomen y mamas.  Es posible que sienta cefaleas, que se pueden aliviar con los medicamentos que su mdico le autorice a utilizar.  Tendr necesidad de orinar con ms frecuencia porque el feto est presionando en la vejiga.  Como consecuencia del embarazo, podr sentir acidez estomacal continuamente.  Podr estar constipada ya que ciertas hormonas hacen que los msculos que empujan los desechos a travs de los intestinos trabajen ms lentamente.  Pueden aparecer hemorroides o abultarse las venas (venas varicosas).  El dolor de espalda se debe al aumento de peso y a que las hormonas del embarazo relajan las articulaciones entre los huesos de la pelvis y a la modificacin del peso y a los msculos que soportan el equilibrio.  Sus mamas seguirn desarrollndose y estarn ms sensibles.  Las encas pueden sangrar y estar sensibles al cepillado y al hilo dental.  Pueden aparecer en el rostro zonas oscuras o manchas (cloasma, mscara del embarazo). Esto  desaparece despus del nacimiento del beb.  Es posible que se formeuna lnea oscura desde el ombligo a la zona del pubis (linea nigra). Esto desaparece despus del nacimiento del beb. QU DEBE ESPERAR EN LAS CONSULTAS PRENATALES  Durante una visita prenatal de rutina:   La pesarn para verificar que usted y el feto se encuentran dentro de los lmites normales.  Le tomarn la presin arterial.  Le medirn el abdomen para verificar el desarrollo del beb.  Escucharn los latidos fetales.  Se evaluarn los resultados de los estudios realizados en visitas anteriores. El mdico le preguntar:   Cmo se siente.  Si siente los movimientos del beb.  Si tiene sntomas anormales, como prdida de lquido, sangrado, dolores de cabeza intensos o clicos abdominales.  Si tiene alguna duda. Otros estudios que podrn realizarse durante el segundo trimestre son:   Anlisis de sangre para evaluar:  Niveles bajos de hierro (anemia).  Diabetes gestacional (entre las 24 y las 28 semanas).  Anticuerpos Rh.  Anlisis de orina para detectar infecciones, diabetes o protenas en la orina.  Una ecografa para confirmar si el crecimiento y el desarrollo del beb son los adecuados.  Una amniocentesis para diagnosticar posibles problemas genticos.  Estudios del feto para descartar espina bfida y sndrome de [place]. INSTRUCCIONES PARA EL CUIDADO EN EL HOGAR   Evite fumar, consumir hierbas, beber alcohol y utilizar frmacos que no ne hayan recetado. Estas sustancias qumicas afectan la formacin y el desarrollo del beb.  Siga las indicaciones del profesional con respecto a como tomar los medicamentos. Durante el embarazo, hay   medicamentos que son seguros y otros no lo son.  Realice actividad fsica slo segn las indicaciones del mdico. Sentir clicos uterinos es el mejor signo para detener la actividad fsica.  Contine haciendo comidas regulares y sanas.  Use un sostn que le brinde buen  soporte si sus mamas estn sensibles.  No utilice la baera con agua caliente, baos turcos y saunas.  Colquese el cinturn de seguridad cuando conduzca.  Evite comer carne cruda y el contacto con los utensilios y desperdicios de los gatos. Estos elementos contienen grmenes que pueden causar defectos de nacimiento en el beb.  Tome las vitaminas indicadas para la etapa prenatal.  Pruebe un laxante (si el mdico la autoriza) si tiene constipacin. Consuma ms alimentos ricos en fibra, como vegetales y frutas frescos y cereales enteros. Beba gran cantidad de lquido para mantener la orina de tono claro o amarillo plido.  Tome baos de agua tibia para calmar el dolor o las molestias causadas por las hemorroides. Use una crema para las hemorroides si el mdico la autoriza.  Si tiene venas varicosas, use medias de soporte. Eleve los pies durante 15 minutos, 3 o 4 veces por da. Limite el consumo de sal en su dieta.  Evite levantar objetos pesados, use zapatos de tacones bajos y mantenga una buena postura.  Descanse con las piernas elevadas si tiene calambres o dolor de cintura.  Visite a su dentista si no lo ha hecho durante el embarazo. Use un cepillo de dientes blando para higienizarse los dientes y use suavemente el hilo dental.  Puede continuar su vida sexual siempre que el mdico la autorice.  Concurra a todas las visitas prenatales segn las indicaciones de su mdico. SOLICITE ATENCIN MDICA SI:   Tiene mareos.  Siente clicos leves, presin en la pelvis o dolor persistente en el abdomen.  Tiene nuseas o vmitos o diarrea persistentes.  Observa una secrecin vaginal con mal olor.  Siente dolor al orinar. SOLICITE ATENCIN MDICA DE INMEDIATO SI:   Tiene fiebre.  Pierde lquido por la vagina.  Tiene sangrando o pequeas prdidas vaginales.  Siente dolor intenso o clicos en el abdomen.  Sube o baja de peso rpidamente.  Tiene dificultad para respirar y le duele  pecho.  Sbitamente se le hincha el rostro, las manos, los tobillos, los pies o las piernas de manera extrema.  No ha sentido los movimientos del beb durante una hora.  Siente un dolor de cabeza intenso que no se alivia con medicamentos.  Su visin se modifica. Document Released: 03/03/2005 Document Revised: 01/24/2013 [employer] Patient Information 2014 [employer].  

## 2013-08-15 DIAGNOSIS — Z361 Encounter for antenatal screening for raised alphafetoprotein level: Secondary | ICD-10-CM

## 2013-08-27 ENCOUNTER — Encounter: Payer: Self-pay | Admitting: Family Medicine

## 2013-08-27 ENCOUNTER — Ambulatory Visit (INDEPENDENT_AMBULATORY_CARE_PROVIDER_SITE_OTHER): Payer: No Typology Code available for payment source | Admitting: Family Medicine

## 2013-08-27 VITALS — BP 125/75 | Wt 155.7 lb

## 2013-08-27 DIAGNOSIS — O99810 Abnormal glucose complicating pregnancy: Secondary | ICD-10-CM

## 2013-08-27 DIAGNOSIS — O24419 Gestational diabetes mellitus in pregnancy, unspecified control: Secondary | ICD-10-CM

## 2013-08-27 LAB — POCT URINALYSIS DIP (DEVICE)
Bilirubin Urine: NEGATIVE
Glucose, UA: NEGATIVE mg/dL
HGB URINE DIPSTICK: NEGATIVE
Ketones, ur: 15 mg/dL — AB
NITRITE: NEGATIVE
PH: 6.5 (ref 5.0–8.0)
PROTEIN: NEGATIVE mg/dL
Specific Gravity, Urine: 1.015 (ref 1.005–1.030)
UROBILINOGEN UA: 0.2 mg/dL (ref 0.0–1.0)

## 2013-08-27 NOTE — Progress Notes (Signed)
Pulse: 79

## 2013-08-27 NOTE — Progress Notes (Signed)
FBS 60-77 2 hr pp 60-122 (1 out of range) Fetal ECHO done last week--per pt. WNL--will try to obtain report

## 2013-08-27 NOTE — Patient Instructions (Signed)
Segundo trimestre del embarazo  (Second Trimester of Pregnancy) El segundo trimestre del embarazo se extiende desde la semana 13 hasta la semana 28, del 4 al 6 mes. En general, es el momento del embarazo en el que se sentir mejor. Su organismo se ha adaptado a estar embarazada y comienza a sentirse fsicamente mejor. En general las nuseas matutinas han disminuido o han desaparecido completamente. El segundo trimestre es tambin la poca en la que el feto se desarrolla rpidamente. Hacia el final del sexto mes, el beb mide aproximadamente 9 pulgadas (23 cm) y pesa alrededor de 1  libras (700 g). Es probable que sienta mover al beb (dar pataditas) entre las 18 y 20 semanas del embarazo.  CAMBIOS CORPORALES  Su organismo atravesar numerosos cambios durante el embarazo. Los cambios varan de una mujer a otra.   Seguir aumentando de peso. Notar que la parte baja del abdomen se ensancha.  Podrn aparecer las primeras estras en las caderas, abdomen y mamas.  Es posible que sienta cefaleas, que se pueden aliviar con los medicamentos que su mdico le autorice a utilizar.  Tendr necesidad de orinar con ms frecuencia porque el feto est presionando en la vejiga.  Como consecuencia del embarazo, podr sentir acidez estomacal continuamente.  Podr estar constipada ya que ciertas hormonas hacen que los msculos que empujan los desechos a travs de los intestinos trabajen ms lentamente.  Pueden aparecer hemorroides o abultarse las venas (venas varicosas).  El dolor de espalda se debe al aumento de peso y a que las hormonas del embarazo relajan las articulaciones entre los huesos de la pelvis y a la modificacin del peso y a los msculos que soportan el equilibrio.  Sus mamas seguirn desarrollndose y estarn ms sensibles.  Las encas pueden sangrar y estar sensibles al cepillado y al hilo dental.  Pueden aparecer en el rostro zonas oscuras o manchas (cloasma, mscara del embarazo). Esto  desaparece despus del nacimiento del beb.  Es posible que se formeuna lnea oscura desde el ombligo a la zona del pubis (linea nigra). Esto desaparece despus del nacimiento del beb. QU DEBE ESPERAR EN LAS CONSULTAS PRENATALES  Durante una visita prenatal de rutina:   La pesarn para verificar que usted y el feto se encuentran dentro de los lmites normales.  Le tomarn la presin arterial.  Le medirn el abdomen para verificar el desarrollo del beb.  Escucharn los latidos fetales.  Se evaluarn los resultados de los estudios realizados en visitas anteriores. El mdico le preguntar:   Cmo se siente.  Si siente los movimientos del beb.  Si tiene sntomas anormales, como prdida de lquido, sangrado, dolores de cabeza intensos o clicos abdominales.  Si tiene alguna duda. Otros estudios que podrn realizarse durante el segundo trimestre son:   Anlisis de sangre para evaluar:  Niveles bajos de hierro (anemia).  Diabetes gestacional (entre las 24 y las 28 semanas).  Anticuerpos Rh.  Anlisis de orina para detectar infecciones, diabetes o protenas en la orina.  Una ecografa para confirmar si el crecimiento y el desarrollo del beb son los adecuados.  Una amniocentesis para diagnosticar posibles problemas genticos.  Estudios del feto para descartar espina bfida y sndrome de Down. INSTRUCCIONES PARA EL CUIDADO EN EL HOGAR   Evite fumar, consumir hierbas, beber alcohol y utilizar frmacos que no ne hayan recetado. Estas sustancias qumicas afectan la formacin y el desarrollo del beb.  Siga las indicaciones del profesional con respecto a como tomar los medicamentos. Durante el embarazo, hay   medicamentos que son seguros y otros no lo son.  Realice actividad fsica slo segn las indicaciones del mdico. Sentir clicos uterinos es el mejor signo para detener la actividad fsica.  Contine haciendo comidas regulares y sanas.  Use un sostn que le brinde buen  soporte si sus mamas estn sensibles.  No utilice la baera con agua caliente, baos turcos y saunas.  Colquese el cinturn de seguridad cuando conduzca.  Evite comer carne cruda y el contacto con los utensilios y desperdicios de los gatos. Estos elementos contienen grmenes que pueden causar defectos de nacimiento en el beb.  Tome las vitaminas indicadas para la etapa prenatal.  Pruebe un laxante (si el mdico la autoriza) si tiene constipacin. Consuma ms alimentos ricos en fibra, como vegetales y frutas frescos y cereales enteros. Beba gran cantidad de lquido para mantener la orina de tono claro o amarillo plido.  Tome baos de agua tibia para calmar el dolor o las molestias causadas por las hemorroides. Use una crema para las hemorroides si el mdico la autoriza.  Si tiene venas varicosas, use medias de soporte. Eleve los pies durante 15 minutos, 3 o 4 veces por da. Limite el consumo de sal en su dieta.  Evite levantar objetos pesados, use zapatos de tacones bajos y mantenga una buena postura.  Descanse con las piernas elevadas si tiene calambres o dolor de cintura.  Visite a su dentista si no lo ha hecho durante el embarazo. Use un cepillo de dientes blando para higienizarse los dientes y use suavemente el hilo dental.  Puede continuar su vida sexual siempre que el mdico la autorice.  Concurra a todas las visitas prenatales segn las indicaciones de su mdico. SOLICITE ATENCIN MDICA SI:   Tiene mareos.  Siente clicos leves, presin en la pelvis o dolor persistente en el abdomen.  Tiene nuseas o vmitos o diarrea persistentes.  Observa una secrecin vaginal con mal olor.  Siente dolor al orinar. SOLICITE ATENCIN MDICA DE INMEDIATO SI:   Tiene fiebre.  Pierde lquido por la vagina.  Tiene sangrando o pequeas prdidas vaginales.  Siente dolor intenso o clicos en el abdomen.  Sube o baja de peso rpidamente.  Tiene dificultad para respirar y le duele  pecho.  Sbitamente se le hincha el rostro, las manos, los tobillos, los pies o las piernas de manera extrema.  No ha sentido los movimientos del beb durante una hora.  Siente un dolor de cabeza intenso que no se alivia con medicamentos.  Su visin se modifica. Document Released: 03/03/2005 Document Revised: 01/24/2013 [employer] Patient Information 2014 [employer].  Lactancia materna (Breastfeeding) Decidir amamantar es una de las mejores elecciones que puede hacer por usted y su beb. El cambio hormonal durante el embarazo produce el desarrollo del tejido mamario y aumenta la cantidad y el tamao de los conductos galactforos. Estas hormonas tambin permiten que las protenas, los azcares y las grasas de la sangre produzcan la leche materna en las glndulas productoras de leche. Las hormonas impiden que la leche materna sea liberada antes del nacimiento del beb, adems de impulsar el flujo de leche luego del nacimiento. Una vez que ha comenzado a amamantar, pensar en el beb, as como la succin o el llanto, pueden estimular la liberacin de leche de las glndulas productoras de leche.  LOS BENEFICIOS DE AMAMANTAR Para el beb  La primera leche (calostro) ayuda al mejor funcionamiento del sistema digestivo del beb.  La leche tiene anticuerpos que ayudan a prevenir las infecciones en el   beb.  El beb tiene una menor incidencia de asma, alergias y del sndrome de muerte sbita del lactante.  Los nutrientes en la leche materna son mejores para el beb que la leche maternizada y estn preparados exclusivamente para cubrir las necesidades del beb.  La leche materna mejora el desarrollo cerebral del beb.  Es menos probable que el beb desarrolle otras enfermedades, como obesidad infantil, asma o diabetes mellitus de tipo 2. Para usted   La lactancia materna favorece el desarrollo de un vnculo muy especial entre la madre y el beb.  Es conveniente. Siempre est disponible a la  temperatura correcta y es econmica.  La lactancia materna ayuda a quemar caloras y a perder el peso ganado durante el embarazo.  Favorece la contraccin del tero al tamao que tena antes del embarazo de manera ms rpida y disminuye el sangrado (loquios) despus del parto.  La lactancia materna contribuye a reducir el riesgo de desarrollar diabetes mellitus de tipo 2, osteoporosis o cncer de mama o de ovario en el futuro. SIGNOS DE QUE EL BEB EST HAMBRIENTO Primeros signos de hambre  Aumenta su estado de alerta o actividad.  Se estira.  Mueve la cabeza de un lado a otro.  Mueve la cabeza y abre la boca cuando se le toca la mejilla o la comisura de la boca (reflejo de bsqueda).  Aumenta las vocalizaciones, tales como sonidos de succin, se relame los labios, emite arrullos, suspiros, o chirridos.  Mueve la mano hacia la boca.  Se chupa con ganas los dedos o las manos. Signos tardos de hambre  Est agitado.  Llora de manera intermitente. Signos de hambre extrema Los signos de hambre extrema requerirn que lo calme y lo consuele antes de que el beb pueda alimentarse adecuadamente. No espere a que se manifiesten los siguientes signos de hambre extrema para comenzar a amamantar:   Agitacin.  Llanto intenso y fuerte.   Gritos. INFORMACIN BSICA SOBRE LA LACTANCIA MATERNA Iniciacin de la lactancia materna  Encuentre un lugar cmodo para sentarse o acostarse, con un buen respaldo para el cuello y la espalda.  Coloque una almohada o una manta enrollada debajo del beb para acomodarlo a la altura de la mama (si est sentada). Las almohadas para amamantar se han diseado especialmente a fin de servir de apoyo para los brazos y el beb mientras amamanta.  Asegrese de que el abdomen del beb est frente al suyo.  Masajee suavemente la mama. Con las yemas de los dedos, masajee la pared del pecho hacia el pezn en un movimiento circular. Esto estimula el flujo de leche. Es  posible que deba continuar este movimiento mientras amamanta si la leche fluye lentamente.  Sostenga la mama con el pulgar por arriba del pezn y los otros 4 dedos por debajo de la mama. Asegrese de que los dedos se encuentren lejos del pezn y de la boca del beb.  Empuje suavemente los labios del beb con el pezn o con el dedo.  Cuando la boca del beb se abra lo suficiente, acrquelo rpidamente a la mama e introduzca todo el pezn y la zona oscura que lo rodea (areola), tanto como sea posible, dentro de la boca del beb.  Debe haber ms areola visible por arriba del labio superior del beb que por debajo del labio inferior.  La lengua del beb debe estar entre la enca inferior y la mama.  Asegrese de que la boca del beb est en la posicin correcta alrededor del pezn (prendida).   Los labios del beb deben crear un sello sobre la mama, doblndose hacia afuera (invertidos).  Es comn que el beb succione durante 2 a 3 minutos para que comience el flujo de leche materna. Cmo debe prenderse Es muy importante que le ensee al beb cmo prenderse adecuadamente a la mama. Si el beb no se prende adecuadamente, puede causarle dolor en el pezn y reducir la produccin de leche materna, y hacer que el beb tenga un escaso aumento de peso. Adems, si el beb no se prende adecuadamente al pezn, puede tragar aire durante la alimentacin. Esto puede causarle molestias al beb. Hacer eructar al beb al cambiar de mama puede ayudarlo a liberar el aire. Sin embargo, ensearle al beb cmo prenderse a la mama adecuadamente es la mejor manera de evitar que se sienta molesto por tragar aire mientras se alimenta. Signos de que el beb se ha prendido adecuadamente al pezn:   Tironea o succiona de modo silencioso, sin causarle dolor.  Se escucha que traga cada 3 o 4 succiones.   Hay movimientos musculares por arriba y por delante de sus odos al succionar. Signos de que el beb no se ha prendido  adecuadamente al pezn:   Hace ruidos de succin o de chasquido mientras se alimenta.  Dolor en el pezn. Si cree que el beb no se prendi correctamente, deslice el dedo en la comisura de la boca y colquelo entre las encas del beb para interrumpir la succin. Intente comenzar a amamantar nuevamente. Signos de lactancia materna exitosa Signos del beb:   Disminucin gradual en el nmero de succiones o cese completo de la succin.  Se duerme.  Relaja el cuerpo.  Retiene una pequea cantidad de leche en su boca.  Se desprende solo del pecho. Signos que presenta usted:  Las mamas han aumentado la firmeza, el peso y el tamao 1 a 3 horas despus de amamantar.  Estn ms blandas inmediatamente despus de amamantar.  Un aumento del volumen de leche, y tambin el cambio de su consistencia y color se producen hacia el quinto da de lactancia materna.  Los pezones no duelen, ni estn agrietados ni sangran. Signos de que su beb recibe la cantidad de leche suficiente  Moja al menos 3 paales en 24 horas. La orina debe ser clara y de color amarillo plido a los 5 das de vida.  Defeca al menos 3 veces en 24 horas a los 5 das de vida. La materia fecal debe ser blanda y amarillenta.  Defeca al menos 3 veces en 24 horas a los 7 das de vida. La materia fecal debe ser grumosa y amarillenta.  No registra una prdida de peso mayor del 10% del peso al nacer durante los primeros 3 das de vida.  Aumenta de peso un promedio de 4 a 7onzas (120 a 210ml) por semana despus de los 4 das de vida.  Aumenta de peso, diariamente, de manera consistente a partir de los 5 das de vida, sin registrar prdida de peso despus de las 2 semanas de vida. Despus de alimentarse, es posible que el beb regurgite una pequea cantidad. Esto es frecuente. FRECUENCIA Y DURACIN DE LA LACTANCIA MATERNA El amamantamiento frecuente la ayudar a producir ms leche y a prevenir problemas de dolor en los pezones e  hinchazn en las mamas. Alimente al beb cuando muestre signos de hambre o si siente la necesidad de reducir la congestin de las mamas. Esto se denomina "lactancia a demanda". Evite el uso del chupete mientras   trabaja para establecer la lactancia (las primeras 4 a 6 semanas despus del nacimiento del beb). Despus de este perodo, podr ofrecerle un chupete. Las investigaciones demostraron que el uso del chupete durante el primer ao de vida del beb disminuye el riesgo de desarrollar el sndrome de muerte sbita del lactante (SMSL). Permita que el nio se alimente en cada mama todo lo que desee. Contine amamantando al beb hasta que haya terminado de alimentarse. Cuando el beb se desprende o se queda dormido mientras se est alimentando de la primera mama, ofrzcale la segunda. Debido a que, con frecuencia, los recin nacidos permanecen somnolientos las primeras semanas de vida, es posible que deba despertar a su beb para alimentarlo. Los horarios de lactancia varan de un beb a otro. Sin embargo, las siguientes reglas pueden servir como gua para ayudarle a garantizar que el beb se alimenta adecuadamente:  Se puede amamantar a los recin nacidos (bebs de 4 semanas o menos de vida) cada 1 a 3 horas.  No deben transcurrir ms de 3 horas durante el da o 5 horas durante la noche sin que se amamante a los recin nacidos.  Debe amamantar al beb 8 veces como mnimo, en un perodo de 24 horas, hasta que comience a introducir slidos en su dieta, a los 6 meses de vida aproximadamente. EXTRACCIN DE LECHE MATERNA La extraccin y el almacenamiento de la leche materna le permiten asegurarse de que el beb se alimente exclusivamente de leche materna, aun en momentos en los que no puede amamantar. Esto tiene especial importancia si debe regresar al trabajo en el perodo en que an est amamantando o si no puede estar presente en los momentos en que el beb debe alimentarse. Su asesor en lactancia puede  orientarla sobre cunto tiempo es seguro almacenar leche materna.  El sacaleche es un aparato que le permite extraer leche de la mama a un recipiente estril. Luego, la leche materna extrada puede almacenarse en un refrigerador o freezer. Algunos sacaleches son manuales, mientras que otros son elctricos. Consulte a su asesor en lactancia qu tipo ser ms conveniente para usted. Los sacaleches se pueden comprar, sin embargo, algunos hospitales y grupos de apoyo a la lactancia materna alquilan sacaleches mensualmente. Un asesor en lactancia puede ensearle cmo extraer leche materna manualmente, en caso de que prefiera no usar un sacaleche.  CMO CUIDAR LAS MAMAS DURANTE LA LACTANCIA MATERNA Los pezones se secan, agrietan y duelen durante la lactancia materna. Las siguientes recomendaciones pueden ayudarle a mantener las mamas humectadas y sanas:  Evite usar jabn en los pezones.  Use un sostn de soporte. Aunque no son esenciales, las camisetas sin mangas o los sostenes especiales para amamantar estn diseados para acceder fcilmente a las mamas, para amamantar sin tener que quitarse todo el sostn o la camiseta. Evite usar sostenes con aro o sostenes muy ajustados.  Seque al aire sus pezones durante 3 a 4minutos despus de amamantar al beb.  Utilice solo apsitos de algodn en el sostn para absorber las prdidas de leche. La prdida de un poco de leche materna entre las tomas es normal.  Utilice lanolina sobre los pezones luego de amamantar. La lanolina ayuda a mantener la humedad normal de la piel. Si usa lanolina pura, no tiene que lavarse los pezones antes de alimentar al beb. La lanolina pura no es txica para el beb. Adems, puede extraer manualmente algunas gotas de leche materna y masajear suavemente esa leche sobre los pezones, para que la leche se seque al   aire. Durante las primeras semanas despus de dar a luz, algunas mujeres pueden experimentar hinchazn en las mamas (congestin  mamaria). La congestin puede hacer que sienta las mamas pesadas, calientes y sensibles al tacto. El pico de la congestin ocurre dentro de los 3 a 5 das despus del parto. Las siguientes recomendaciones pueden ayudarle a aliviar la congestin:  Vace por completo las mamas al amamantar o extraer leche. Puede aplicar calor hmedo en las mamas (en la ducha o con toallas hmedas para manos) antes de amamantar o extraer leche. Esto aumenta la circulacin y ayuda a que la leche fluya. Si el beb no vaca por completo las mamas cuando lo amamanta, extraiga la leche restante despus de que haya finalizado.  Use un sostn ajustado (para amamantar o comn) o camiseta sin mangas durante 1 o 2 das para indicar al cuerpo que disminuya ligeramente la produccin de leche.  Aplique compresas de hielo sobre las mamas, a menos que le resulte demasiado incmodo.  Asegrese de que el beb se encuentre en la posicin correcta mientras lo alimenta. Si la congestin persiste luego de 48 horas o despus de seguir estas recomendaciones, comunquese con su mdico o un asesor en lactancia. RECOMENDACIONES GENERALES PARA EL CUIDADO DE LA SALUD DURANTE LA LACTANCIA MATERNA  Consuma alimentos saludables. Alterne comidas y colaciones, comiendo 3 de cada una por da. Dado que lo que come afecta la leche materna, es posible que algunas comidas hagan que su beb se vuelva ms irritable de lo habitual. Evite comer este tipo de alimentos, si percibe que afectan de manera negativa al beb.  Beba leche, jugos de fruta y agua para satisfacer su sed (aproximadamente 10 vasos al da).  Descanse con frecuencia, reljese y tome sus vitaminas prenatales para evitar la fatiga, el estrs y la anemia.  Contine con los autocontroles de la mama.  Evite masticar y fumar tabaco.  Evite el consumo de alcohol y drogas. Algunos medicamentos, que pueden ser perjudiciales para el beb, pueden pasar a travs de la leche materna. Es importante  que consulte a su mdico antes de tomar cualquier medicamento, incluidos todos los medicamentos recetados y de venta libre, as como los suplementos vitamnicos y herbales. Puede quedar embarazada durante la lactancia. Si desea controlar la natalidad, consulte a su mdico cules son las opciones ms seguras para el beb. SOLICITE ATENCIN MDICA SI:   Usted siente que quiere dejar de amamantar o se siente frustrada con la lactancia.  Siente dolor en las mamas o en los pezones.  Sus pezones estn agrietados o sangran.  Sus pechos estn irritados, sensibles o calientes.  Tiene un rea hinchada en cualquiera de las mamas.  Siente escalofros o fiebre.  Tiene nuseas o vmitos.  Presenta una secrecin de otro lquido distinto de la leche materna de los pezones.  Sus mamas no se llenan antes de amamantar al beb para el 5. da despus del parto.  Se siente triste y deprimida.  El beb est demasiado somnoliento como para comer bien.  El beb tiene problemas para dormir.  Moja menos de 3 paales en 24 horas.  Defeca menos de 3 veces en 24 horas.  La piel del beb o la parte blanca de sus ojos est amarilla.  El beb no ha aumentado de peso a los 5 das de vida. SOLICITE ATENCIN MDICA DE INMEDIATO SI:   El beb est muy cansado (aletargado) y no se despierta para comer.  Le sube la fiebre sin causa. Document Released: 05/24/2005 Document   Revised: 09/18/2012 [employer] Patient Information 2014 [employer].  

## 2013-08-28 ENCOUNTER — Encounter: Payer: Self-pay | Admitting: *Deleted

## 2013-08-28 ENCOUNTER — Encounter: Payer: Self-pay | Admitting: Family Medicine

## 2013-09-10 ENCOUNTER — Ambulatory Visit (INDEPENDENT_AMBULATORY_CARE_PROVIDER_SITE_OTHER): Payer: No Typology Code available for payment source | Admitting: Family Medicine

## 2013-09-10 ENCOUNTER — Encounter: Payer: Self-pay | Admitting: Family Medicine

## 2013-09-10 VITALS — BP 130/78 | Temp 97.8°F | Wt 154.2 lb

## 2013-09-10 DIAGNOSIS — O99810 Abnormal glucose complicating pregnancy: Secondary | ICD-10-CM

## 2013-09-10 DIAGNOSIS — O24419 Gestational diabetes mellitus in pregnancy, unspecified control: Secondary | ICD-10-CM

## 2013-09-10 LAB — POCT URINALYSIS DIP (DEVICE)
Bilirubin Urine: NEGATIVE
Glucose, UA: NEGATIVE mg/dL
Hgb urine dipstick: NEGATIVE
Ketones, ur: NEGATIVE mg/dL
LEUKOCYTES UA: NEGATIVE
NITRITE: NEGATIVE
PH: 7 (ref 5.0–8.0)
PROTEIN: NEGATIVE mg/dL
Specific Gravity, Urine: 1.01 (ref 1.005–1.030)
UROBILINOGEN UA: 0.2 mg/dL (ref 0.0–1.0)

## 2013-09-10 MED ORDER — GLYBURIDE 2.5 MG PO TABS: 2.5000 mg | ORAL_TABLET | Freq: Every day | ORAL | Status: DC

## 2013-09-10 MED ORDER — HYDROCORTISONE ACETATE 25 MG RE SUPP: 25.0000 mg | Freq: Two times a day (BID) | RECTAL | Status: DC

## 2013-09-10 NOTE — Progress Notes (Signed)
P - 67 

## 2013-09-10 NOTE — Patient Instructions (Addendum)
Segundo trimestre del embarazo  (Second Trimester of Pregnancy) El segundo trimestre del embarazo se extiende desde la semana 13 hasta la semana 28, del 4 al 6 mes. En general, es el momento del embarazo en el que se sentir mejor. Su organismo se ha adaptado a estar embarazada y comienza a sentirse fsicamente mejor. En general las nuseas matutinas han disminuido o han desaparecido completamente. El segundo trimestre es tambin la poca en la que el feto se desarrolla rpidamente. Hacia el final del sexto mes, el beb mide aproximadamente 9 pulgadas (23 cm) y pesa alrededor de 1  libras (700 g). Es probable que sienta mover al beb (dar pataditas) entre las 18 y 20 semanas del embarazo.  CAMBIOS CORPORALES  Su organismo atravesar numerosos cambios durante el embarazo. Los cambios varan de una mujer a otra.   Seguir aumentando de peso. Notar que la parte baja del abdomen se ensancha.  Podrn aparecer las primeras estras en las caderas, abdomen y mamas.  Es posible que sienta cefaleas, que se pueden aliviar con los medicamentos que su mdico le autorice a utilizar.  Tendr necesidad de orinar con ms frecuencia porque el feto est presionando en la vejiga.  Como consecuencia del embarazo, podr sentir acidez estomacal continuamente.  Podr estar constipada ya que ciertas hormonas hacen que los msculos que empujan los desechos a travs de los intestinos trabajen ms lentamente.  Pueden aparecer hemorroides o abultarse las venas (venas varicosas).  El dolor de espalda se debe al aumento de peso y a que las hormonas del embarazo relajan las articulaciones entre los huesos de la pelvis y a la modificacin del peso y a los msculos que soportan el equilibrio.  Sus mamas seguirn desarrollndose y estarn ms sensibles.  Las encas pueden sangrar y estar sensibles al cepillado y al hilo dental.  Pueden aparecer en el rostro zonas oscuras o manchas (cloasma, mscara del embarazo). Esto  desaparece despus del nacimiento del beb.  Es posible que se formeuna lnea oscura desde el ombligo a la zona del pubis (linea nigra). Esto desaparece despus del nacimiento del beb. QU DEBE ESPERAR EN LAS CONSULTAS PRENATALES  Durante una visita prenatal de rutina:   La pesarn para verificar que usted y el feto se encuentran dentro de los lmites normales.  Le tomarn la presin arterial.  Le medirn el abdomen para verificar el desarrollo del beb.  Escucharn los latidos fetales.  Se evaluarn los resultados de los estudios realizados en visitas anteriores. El mdico le preguntar:   Cmo se siente.  Si siente los movimientos del beb.  Si tiene sntomas anormales, como prdida de lquido, sangrado, dolores de cabeza intensos o clicos abdominales.  Si tiene alguna duda. Otros estudios que podrn realizarse durante el segundo trimestre son:   Anlisis de sangre para evaluar:  Niveles bajos de hierro (anemia).  Diabetes gestacional (entre las 24 y las 28 semanas).  Anticuerpos Rh.  Anlisis de orina para detectar infecciones, diabetes o protenas en la orina.  Una ecografa para confirmar si el crecimiento y el desarrollo del beb son los adecuados.  Una amniocentesis para diagnosticar posibles problemas genticos.  Estudios del feto para descartar espina bfida y sndrome de [place]. INSTRUCCIONES PARA EL CUIDADO EN EL HOGAR   Evite fumar, consumir hierbas, beber alcohol y utilizar frmacos que no ne hayan recetado. Estas sustancias qumicas afectan la formacin y el desarrollo del beb.  Siga las indicaciones del profesional con respecto a como tomar los medicamentos. Durante el embarazo, hay   medicamentos que son seguros y otros no lo son.  Realice actividad fsica slo segn las indicaciones del mdico. Sentir clicos uterinos es el mejor signo para detener la actividad fsica.  Contine haciendo comidas regulares y sanas.  Use un sostn que le brinde buen  soporte si sus mamas estn sensibles.  No utilice la baera con agua caliente, baos turcos y saunas.  Colquese el cinturn de seguridad cuando conduzca.  Evite comer carne cruda y el contacto con los utensilios y desperdicios de los gatos. Estos elementos contienen grmenes que pueden causar defectos de nacimiento en el beb.  Tome las vitaminas indicadas para la etapa prenatal.  Pruebe un laxante (si el mdico la autoriza) si tiene constipacin. Consuma ms alimentos ricos en fibra, como vegetales y frutas frescos y cereales enteros. Beba gran cantidad de lquido para mantener la orina de tono claro o amarillo plido.  Tome baos de agua tibia para calmar el dolor o las molestias causadas por las hemorroides. Use una crema para las hemorroides si el mdico la autoriza.  Si tiene venas varicosas, use medias de soporte. Eleve los pies durante 15 minutos, 3 o 4 veces por da. Limite el consumo de sal en su dieta.  Evite levantar objetos pesados, use zapatos de tacones bajos y mantenga una buena postura.  Descanse con las piernas elevadas si tiene calambres o dolor de cintura.  Visite a su dentista si no lo ha hecho durante el embarazo. Use un cepillo de dientes blando para higienizarse los dientes y use suavemente el hilo dental.  Puede continuar su vida sexual siempre que el mdico la autorice.  Concurra a todas las visitas prenatales segn las indicaciones de su mdico. SOLICITE ATENCIN MDICA SI:   Tiene mareos.  Siente clicos leves, presin en la pelvis o dolor persistente en el abdomen.  Tiene nuseas o vmitos o diarrea persistentes.  Observa una secrecin vaginal con mal olor.  Siente dolor al orinar. SOLICITE ATENCIN MDICA DE INMEDIATO SI:   Tiene fiebre.  Pierde lquido por la vagina.  Tiene sangrando o pequeas prdidas vaginales.  Siente dolor intenso o clicos en el abdomen.  Sube o baja de peso rpidamente.  Tiene dificultad para respirar y le duele  pecho.  Sbitamente se le hincha el rostro, las manos, los tobillos, los pies o las piernas de manera extrema.  No ha sentido los movimientos del beb durante una hora.  Siente un dolor de cabeza intenso que no se alivia con medicamentos.  Su visin se modifica. Document Released: 03/03/2005 Document Revised: 01/24/2013 [employer] Patient Information 2014 [employer].  Lactancia materna (Breastfeeding) Decidir amamantar es una de las mejores elecciones que puede hacer por usted y su beb. El cambio hormonal durante el embarazo produce el desarrollo del tejido mamario y aumenta la cantidad y el tamao de los conductos galactforos. Estas hormonas tambin permiten que las protenas, los azcares y las grasas de la sangre produzcan la leche materna en las glndulas productoras de leche. Las hormonas impiden que la leche materna sea liberada antes del nacimiento del beb, adems de impulsar el flujo de leche luego del nacimiento. Una vez que ha comenzado a amamantar, pensar en el beb, as como la succin o el llanto, pueden estimular la liberacin de leche de las glndulas productoras de leche.  LOS BENEFICIOS DE AMAMANTAR Para el beb  La primera leche (calostro) ayuda al mejor funcionamiento del sistema digestivo del beb.  La leche tiene anticuerpos que ayudan a prevenir las infecciones en el   beb.  El beb tiene una menor incidencia de asma, alergias y del sndrome de muerte sbita del lactante.  Los nutrientes en la leche materna son mejores para el beb que la leche maternizada y estn preparados exclusivamente para cubrir las necesidades del beb.  La leche materna mejora el desarrollo cerebral del beb.  Es menos probable que el beb desarrolle otras enfermedades, como obesidad infantil, asma o diabetes mellitus de tipo 2. Para usted   La lactancia materna favorece el desarrollo de un vnculo muy especial entre la madre y el beb.  Es conveniente. Siempre est disponible a la  temperatura correcta y es econmica.  La lactancia materna ayuda a quemar caloras y a perder el peso ganado durante el embarazo.  Favorece la contraccin del tero al tamao que tena antes del embarazo de manera ms rpida y disminuye el sangrado (loquios) despus del parto.  La lactancia materna contribuye a reducir el riesgo de desarrollar diabetes mellitus de tipo 2, osteoporosis o cncer de mama o de ovario en el futuro. SIGNOS DE QUE EL BEB EST HAMBRIENTO Primeros signos de hambre  Aumenta su estado de alerta o actividad.  Se estira.  Mueve la cabeza de un lado a otro.  Mueve la cabeza y abre la boca cuando se le toca la mejilla o la comisura de la boca (reflejo de bsqueda).  Aumenta las vocalizaciones, tales como sonidos de succin, se relame los labios, emite arrullos, suspiros, o chirridos.  Mueve la mano hacia la boca.  Se chupa con ganas los dedos o las manos. Signos tardos de hambre  Est agitado.  Llora de manera intermitente. Signos de hambre extrema Los signos de hambre extrema requerirn que lo calme y lo consuele antes de que el beb pueda alimentarse adecuadamente. No espere a que se manifiesten los siguientes signos de hambre extrema para comenzar a amamantar:   Agitacin.  Llanto intenso y fuerte.   Gritos. INFORMACIN BSICA SOBRE LA LACTANCIA MATERNA Iniciacin de la lactancia materna  Encuentre un lugar cmodo para sentarse o acostarse, con un buen respaldo para el cuello y la espalda.  Coloque una almohada o una manta enrollada debajo del beb para acomodarlo a la altura de la mama (si est sentada). Las almohadas para amamantar se han diseado especialmente a fin de servir de apoyo para los brazos y el beb mientras amamanta.  Asegrese de que el abdomen del beb est frente al suyo.  Masajee suavemente la mama. Con las yemas de los dedos, masajee la pared del pecho hacia el pezn en un movimiento circular. Esto estimula el flujo de leche. Es  posible que deba continuar este movimiento mientras amamanta si la leche fluye lentamente.  Sostenga la mama con el pulgar por arriba del pezn y los otros 4 dedos por debajo de la mama. Asegrese de que los dedos se encuentren lejos del pezn y de la boca del beb.  Empuje suavemente los labios del beb con el pezn o con el dedo.  Cuando la boca del beb se abra lo suficiente, acrquelo rpidamente a la mama e introduzca todo el pezn y la zona oscura que lo rodea (areola), tanto como sea posible, dentro de la boca del beb.  Debe haber ms areola visible por arriba del labio superior del beb que por debajo del labio inferior.  La lengua del beb debe estar entre la enca inferior y la mama.  Asegrese de que la boca del beb est en la posicin correcta alrededor del pezn (prendida).   Los labios del beb deben crear un sello sobre la mama, doblndose hacia afuera (invertidos).  Es comn que el beb succione durante 2 a 3 minutos para que comience el flujo de leche materna. Cmo debe prenderse Es muy importante que le ensee al beb cmo prenderse adecuadamente a la mama. Si el beb no se prende adecuadamente, puede causarle dolor en el pezn y reducir la produccin de leche materna, y hacer que el beb tenga un escaso aumento de peso. Adems, si el beb no se prende adecuadamente al pezn, puede tragar aire durante la alimentacin. Esto puede causarle molestias al beb. Hacer eructar al beb al cambiar de mama puede ayudarlo a liberar el aire. Sin embargo, ensearle al beb cmo prenderse a la mama adecuadamente es la mejor manera de evitar que se sienta molesto por tragar aire mientras se alimenta. Signos de que el beb se ha prendido adecuadamente al pezn:   Tironea o succiona de modo silencioso, sin causarle dolor.  Se escucha que traga cada 3 o 4 succiones.   Hay movimientos musculares por arriba y por delante de sus odos al succionar. Signos de que el beb no se ha prendido  adecuadamente al pezn:   Hace ruidos de succin o de chasquido mientras se alimenta.  Dolor en el pezn. Si cree que el beb no se prendi correctamente, deslice el dedo en la comisura de la boca y colquelo entre las encas del beb para interrumpir la succin. Intente comenzar a amamantar nuevamente. Signos de lactancia materna exitosa Signos del beb:   Disminucin gradual en el nmero de succiones o cese completo de la succin.  Se duerme.  Relaja el cuerpo.  Retiene una pequea cantidad de leche en su boca.  Se desprende solo del pecho. Signos que presenta usted:  Las mamas han aumentado la firmeza, el peso y el tamao 1 a 3 horas despus de amamantar.  Estn ms blandas inmediatamente despus de amamantar.  Un aumento del volumen de leche, y tambin el cambio de su consistencia y color se producen hacia el quinto da de lactancia materna.  Los pezones no duelen, ni estn agrietados ni sangran. Signos de que su beb recibe la cantidad de leche suficiente  Moja al menos 3 paales en 24 horas. La orina debe ser clara y de color amarillo plido a los 5 das de vida.  Defeca al menos 3 veces en 24 horas a los 5 das de vida. La materia fecal debe ser blanda y amarillenta.  Defeca al menos 3 veces en 24 horas a los 7 das de vida. La materia fecal debe ser grumosa y amarillenta.  No registra una prdida de peso mayor del 10% del peso al nacer durante los primeros 3 das de vida.  Aumenta de peso un promedio de 4 a 7onzas (120 a 210ml) por semana despus de los 4 das de vida.  Aumenta de peso, diariamente, de manera consistente a partir de los 5 das de vida, sin registrar prdida de peso despus de las 2 semanas de vida. Despus de alimentarse, es posible que el beb regurgite una pequea cantidad. Esto es frecuente. FRECUENCIA Y DURACIN DE LA LACTANCIA MATERNA El amamantamiento frecuente la ayudar a producir ms leche y a prevenir problemas de dolor en los pezones e  hinchazn en las mamas. Alimente al beb cuando muestre signos de hambre o si siente la necesidad de reducir la congestin de las mamas. Esto se denomina "lactancia a demanda". Evite el uso del chupete mientras   trabaja para establecer la lactancia (las primeras 4 a 6 semanas despus del nacimiento del beb). Despus de este perodo, podr ofrecerle un chupete. Las investigaciones demostraron que el uso del chupete durante el primer ao de vida del beb disminuye el riesgo de desarrollar el sndrome de muerte sbita del lactante (SMSL). Permita que el nio se alimente en cada mama todo lo que desee. Contine amamantando al beb hasta que haya terminado de alimentarse. Cuando el beb se desprende o se queda dormido mientras se est alimentando de la primera mama, ofrzcale la segunda. Debido a que, con frecuencia, los recin nacidos permanecen somnolientos las primeras semanas de vida, es posible que deba despertar a su beb para alimentarlo. Los horarios de lactancia varan de un beb a otro. Sin embargo, las siguientes reglas pueden servir como gua para ayudarle a garantizar que el beb se alimenta adecuadamente:  Se puede amamantar a los recin nacidos (bebs de 4 semanas o menos de vida) cada 1 a 3 horas.  No deben transcurrir ms de 3 horas durante el da o 5 horas durante la noche sin que se amamante a los recin nacidos.  Debe amamantar al beb 8 veces como mnimo, en un perodo de 24 horas, hasta que comience a introducir slidos en su dieta, a los 6 meses de vida aproximadamente. EXTRACCIN DE LECHE MATERNA La extraccin y el almacenamiento de la leche materna le permiten asegurarse de que el beb se alimente exclusivamente de leche materna, aun en momentos en los que no puede amamantar. Esto tiene especial importancia si debe regresar al trabajo en el perodo en que an est amamantando o si no puede estar presente en los momentos en que el beb debe alimentarse. Su asesor en lactancia puede  orientarla sobre cunto tiempo es seguro almacenar leche materna.  El sacaleche es un aparato que le permite extraer leche de la mama a un recipiente estril. Luego, la leche materna extrada puede almacenarse en un refrigerador o freezer. Algunos sacaleches son manuales, mientras que otros son elctricos. Consulte a su asesor en lactancia qu tipo ser ms conveniente para usted. Los sacaleches se pueden comprar, sin embargo, algunos hospitales y grupos de apoyo a la lactancia materna alquilan sacaleches mensualmente. Un asesor en lactancia puede ensearle cmo extraer leche materna manualmente, en caso de que prefiera no usar un sacaleche.  CMO CUIDAR LAS MAMAS DURANTE LA LACTANCIA MATERNA Los pezones se secan, agrietan y duelen durante la lactancia materna. Las siguientes recomendaciones pueden ayudarle a mantener las mamas humectadas y sanas:  Evite usar jabn en los pezones.  Use un sostn de soporte. Aunque no son esenciales, las camisetas sin mangas o los sostenes especiales para amamantar estn diseados para acceder fcilmente a las mamas, para amamantar sin tener que quitarse todo el sostn o la camiseta. Evite usar sostenes con aro o sostenes muy ajustados.  Seque al aire sus pezones durante 3 a 4minutos despus de amamantar al beb.  Utilice solo apsitos de algodn en el sostn para absorber las prdidas de leche. La prdida de un poco de leche materna entre las tomas es normal.  Utilice lanolina sobre los pezones luego de amamantar. La lanolina ayuda a mantener la humedad normal de la piel. Si usa lanolina pura, no tiene que lavarse los pezones antes de alimentar al beb. La lanolina pura no es txica para el beb. Adems, puede extraer manualmente algunas gotas de leche materna y masajear suavemente esa leche sobre los pezones, para que la leche se seque al   aire. Durante las primeras semanas despus de dar a luz, algunas mujeres pueden experimentar hinchazn en las mamas (congestin  mamaria). La congestin puede hacer que sienta las mamas pesadas, calientes y sensibles al tacto. El pico de la congestin ocurre dentro de los 3 a 5 das despus del parto. Las siguientes recomendaciones pueden ayudarle a aliviar la congestin:  Vace por completo las mamas al amamantar o extraer leche. Puede aplicar calor hmedo en las mamas (en la ducha o con toallas hmedas para manos) antes de amamantar o extraer leche. Esto aumenta la circulacin y ayuda a que la leche fluya. Si el beb no vaca por completo las mamas cuando lo amamanta, extraiga la leche restante despus de que haya finalizado.  Use un sostn ajustado (para amamantar o comn) o camiseta sin mangas durante 1 o 2 das para indicar al cuerpo que disminuya ligeramente la produccin de leche.  Aplique compresas de hielo sobre las mamas, a menos que le resulte demasiado incmodo.  Asegrese de que el beb se encuentre en la posicin correcta mientras lo alimenta. Si la congestin persiste luego de 48 horas o despus de seguir estas recomendaciones, comunquese con su mdico o un asesor en lactancia. RECOMENDACIONES GENERALES PARA EL CUIDADO DE LA SALUD DURANTE LA LACTANCIA MATERNA  Consuma alimentos saludables. Alterne comidas y colaciones, comiendo 3 de cada una por da. Dado que lo que come afecta la leche materna, es posible que algunas comidas hagan que su beb se vuelva ms irritable de lo habitual. Evite comer este tipo de alimentos, si percibe que afectan de manera negativa al beb.  Beba leche, jugos de fruta y agua para satisfacer su sed (aproximadamente 10 vasos al da).  Descanse con frecuencia, reljese y tome sus vitaminas prenatales para evitar la fatiga, el estrs y la anemia.  Contine con los autocontroles de la mama.  Evite masticar y fumar tabaco.  Evite el consumo de alcohol y drogas. Algunos medicamentos, que pueden ser perjudiciales para el beb, pueden pasar a travs de la leche materna. Es importante  que consulte a su mdico antes de tomar cualquier medicamento, incluidos todos los medicamentos recetados y de venta libre, as como los suplementos vitamnicos y herbales. Puede quedar embarazada durante la lactancia. Si desea controlar la natalidad, consulte a su mdico cules son las opciones ms seguras para el beb. SOLICITE ATENCIN MDICA SI:   Usted siente que quiere dejar de amamantar o se siente frustrada con la lactancia.  Siente dolor en las mamas o en los pezones.  Sus pezones estn agrietados o sangran.  Sus pechos estn irritados, sensibles o calientes.  Tiene un rea hinchada en cualquiera de las mamas.  Siente escalofros o fiebre.  Tiene nuseas o vmitos.  Presenta una secrecin de otro lquido distinto de la leche materna de los pezones.  Sus mamas no se llenan antes de amamantar al beb para el 5. da despus del parto.  Se siente triste y deprimida.  El beb est demasiado somnoliento como para comer bien.  El beb tiene problemas para dormir.  Moja menos de 3 paales en 24 horas.  Defeca menos de 3 veces en 24 horas.  La piel del beb o la parte blanca de sus ojos est amarilla.  El beb no ha aumentado de peso a los 5 das de vida. SOLICITE ATENCIN MDICA DE INMEDIATO SI:   El beb est muy cansado (aletargado) y no se despierta para comer.  Le sube la fiebre sin causa. Document Released: 05/24/2005 Document   Revised: 09/18/2012 ExitCare Patient Information 2014 [place]. Eczema (Eczema) El eczema, tambin llamada dermatitis atpica, es una afeccin de la piel que causa inflamacin de la misma. Este trastorno produce una erupcin roja y sequedad y escamas en la piel. Hay gran picazn. El eczema generalmente empeora durante los meses fros del invierno y generalmente desaparece o mejora con el tiempo clido del verano. El eczema generalmente comienza a manifestarse en la infancia. Algunos nios desarrollan este trastorno y ste puede  prolongarse en la Estate manager/land agentadultez.  CAUSAS  La causa exacta no se conoce pero parece ser una afeccin hereditaria. Generalmente las personas que sufren eczema tienen una historia familiar de eczema, alergias, asma o fiebre de heno. Esta enfermedad no es contagiosa. Algunas causas de los brotes pueden ser:   Contacto con alguna cosa a la que es sensible o Best boyalrgico.  Librarian, academicstrs. SIGNOS Y SNTOMAS  Piel seca y escamosa.  Erupcin roja y que pica.  Picazn. Esta puede ocurrir antes de que aparezca la erupcin y puede ser muy intensa. DIAGNSTICO  El diagnstico de eczema se realiza basndose en los sntomas y en la historia clnica. TRATAMIENTO  Lubriderm or Eucerin cream El eczema no puede curarse, pero los sntomas generalmente pueden controlarse con tratamiento y Development worker, communityotras estrategias. Un plan de tratamiento puede incluir:  Control de la picazn y el rascado.  Utilice antihistamnicos de venta libre segn las indicaciones, para Associate Professoraliviar la picazn. Es especialmente til por las noches cuando la picazn tiende a Theme park managerempeorar.  Utilice medicamentos de venta libre para la picazn, segn las indicaciones del mdico.  Evite rascarse. El rascado hace que la picazn empeore. Tambin puede producir una infeccin en la piel (imptigo) debido a las lesiones en la piel causadas por el rascado.  Mantenga la piel bien humectada con cremas, todos Sanderslos das. La piel quedar hmeda y ayudar a prevenir la sequedad. Las lociones que contengan alcohol y agua deben evitarse debido a que pueden Best boysecar la piel.  Limite la exposicin a las cosas a las que es sensible o alrgico (alrgenos).  Reconozca las situaciones que puedan causar estrs.  Desarrolle un plan para controlar el estrs. INSTRUCCIONES PARA EL CUIDADO EN EL HOGAR   Tome slo medicamentos de venta libre o recetados, segn las indicaciones del mdico.  No aplique nada sobre la piel sin Science writerconsultar a su mdico.  Deber tomar baos o duchas de corta duracin  (5 minutos) en agua tibia (no caliente). Use jabones suaves para el bao. No deben tener perfume. Puede agregar aceite de bao no perfumado al agua del bao. Es Manufacturing engineermejor evitar el jabn y el bao de espuma.  Inmediatamente despus del bao o de la ducha, cuando la piel aun est hmeda, aplique una crema humectante en todo el cuerpo. Este ungento debe ser en base a vaselina. La piel quedar hmeda y ayudar a prevenir la sequedad. Cuanto ms espeso sea el ungento, mejor. No deben tener perfume.  Mantenga las uas cortas. Es posible que los nios con eczema necesiten usar guantes o mitones por la noche, despus de aplicarse el ungento.  Vista al McGraw-Hillnio con ropa de algodn o Chief of Staffmezcla de algodn. Vstalo con ropas ligeras ya que el calor aumenta la picazn.  Un nio con eczema debe permanecer alejado de personas que tengan ampollas febriles o llagas del resfro. El virus que causa las ampollas febriles (herpes simple) puede ocasionar una infeccin grave en la piel de los nios que padecen eczema. SOLICITE ATENCIN MDICA SI:   La picazn le impide  dormir.  La erupcin empeora o no mejora dentro de la semana en la que se inicia el [place].  Observa pus o costras amarillas en la zona de la erupcin.  Tiene fiebre.  Aparece un brote despus de haber estado en contacto con alguna persona que tiene ampollas febriles. Document Released: 05/24/2005 Document Revised: 03/14/2013 [facility] Patient Information 2014 [place], [place].

## 2013-09-10 NOTE — Progress Notes (Signed)
FBS 68-92 2 hr pp 79-166 (6 of 42 out of range) Has been to [facility]-for retinopathy scan-will get results. Varicose veins Hemorrhoids Itchy rash--looks eczematous--trial of Lubriderm or Eucerin.

## 2013-09-10 NOTE — Progress Notes (Signed)
U/S scheduled 09/24/13 at 915 am.

## 2013-09-24 ENCOUNTER — Ambulatory Visit (INDEPENDENT_AMBULATORY_CARE_PROVIDER_SITE_OTHER): Payer: No Typology Code available for payment source | Admitting: Family Medicine

## 2013-09-24 ENCOUNTER — Ambulatory Visit (HOSPITAL_COMMUNITY)
Admission: RE | Admit: 2013-09-24 | Discharge: 2013-09-24 | Disposition: A | Discharge status: Home / Self Care | Payer: No Typology Code available for payment source | Source: Ambulatory Visit | Attending: Family Medicine | Admitting: Family Medicine

## 2013-09-24 VITALS — BP 125/75 | HR 73 | Temp 97.7°F | Wt 153.0 lb

## 2013-09-24 DIAGNOSIS — O99810 Abnormal glucose complicating pregnancy: Secondary | ICD-10-CM | POA: Insufficient documentation

## 2013-09-24 DIAGNOSIS — O24419 Gestational diabetes mellitus in pregnancy, unspecified control: Secondary | ICD-10-CM

## 2013-09-24 DIAGNOSIS — Z23 Encounter for immunization: Secondary | ICD-10-CM

## 2013-09-24 LAB — POCT URINALYSIS DIP (DEVICE)
Bilirubin Urine: NEGATIVE
GLUCOSE, UA: NEGATIVE mg/dL
Hgb urine dipstick: NEGATIVE
Ketones, ur: NEGATIVE mg/dL
Leukocytes, UA: NEGATIVE
Nitrite: NEGATIVE
PROTEIN: NEGATIVE mg/dL
SPECIFIC GRAVITY, URINE: 1.015 (ref 1.005–1.030)
UROBILINOGEN UA: 1 mg/dL (ref 0.0–1.0)
pH: 7 (ref 5.0–8.0)

## 2013-09-24 LAB — CBC
HCT: 33.5 % — ABNORMAL LOW (ref 36.0–46.0)
Hemoglobin: 11.7 g/dL — ABNORMAL LOW (ref 12.0–15.0)
MCH: 30 pg (ref 26.0–34.0)
MCHC: 34.9 g/dL (ref 30.0–36.0)
MCV: 85.9 fL (ref 78.0–100.0)
PLATELETS: 257 10*3/uL (ref 150–400)
RBC: 3.9 MIL/uL (ref 3.87–5.11)
RDW: 14.2 % (ref 11.5–15.5)
WBC: 8.5 10*3/uL (ref 4.0–10.5)

## 2013-09-24 MED ORDER — TETANUS-DIPHTH-ACELL PERTUSSIS 5-2.5-18.5 LF-MCG/0.5 IM SUSP: 0.5000 mL | Freq: Once | INTRAMUSCULAR | Status: DC

## 2013-09-24 NOTE — Patient Instructions (Signed)
Diabetes mellitus gestacional  (Gestational Diabetes Mellitus) La diabetes mellitus gestacional, ms comnmente conocida como diabetes gestacional es un tipo de diabetes que desarrollan algunas mujeres durante el Aline. En la diabetes gestacional, el pncreas no produce suficiente insulina (una hormona), las clulas son menos sensibles a la insulina que se produce (resistencia a la insulina), o ambos.Normalmente, la Lo[employer]s Corporation azcares de los alimentos a las clulas de los tejidos. Las clulas de los tejidos [employer] azcares para Dealer. La falta de insulina o la falta de una respuesta normal a la insulina hace que el exceso de azcar se acumule en la sangre en lugar de Location manager en las clulas de los tejidos. Como resultado, se desarrollan los niveles altos de Dispensing optician (hiperglucemia). El efecto de los niveles altos de azcar (glucosa) puede causar muchas complicaciones.  FACTORES DE RIESGO Usted tiene mayor probabilidad de desarrollar diabetes gestacional si tiene antecedentes familiares de diabetes y tambin si tiene uno o ms de los siguientes factores de riesgo:   ndice de masa corporal superior a 30 (obesidad).  Embarazo previo con diabetes gestacional.  Mayor edad en el momento del [place]. Si se mantienen los niveles de Multimedia programmer en un rango normal durante el [place], las mujeres pueden tener un embarazo saludable. Si no controla bien sus niveles de glucosa en sangre, pueden tener tener riesgos usted, su beb antes de nacer (feto), el [place] de parto y [place], o el beb recin nacido.  SNTOMAS  Si se presentan sntomas, stos son similares a los sntomas que normalmente experimentar durante el embarazo. Los sntomas de la diabetes gestacional son:   Lovey Newcomer de la sed (polidipsia).  Aumento de ganas de Garment/textile technologist (poliuria).  Aumento de ganas de orinar durante la noche (nicturia).  Prdida de peso. Prdida de Washington [employer] puede ser muy  rpida.  Infecciones frecuentes y recurrentes.  Cansancio (fatiga).  Debilidad.  Cambios en la visin, como visin borrosa.  Olor a Medical illustrator.  Dolor abdominal. DIAGNSTICO La diabetes se diagnostica cuando hay aumento de los niveles de glucosa en la [place]. El nivel de glucosa en la sangre puede controlarse en uno o ms de los siguientes anlisis de sangre:   Medicin de glucosa en sangre en ayunas. No deber comer durante al menos 8 horas antes de que se tome la [place] de [place].  Anlisis al azar de glucosa en sangre. El nivel de glucosa en sangre se controla en cualquier momento del da sin importar el momento en que haya comido.  Pruebas de glucosa de sangre de hemoglobina A1c. Un anlisis de la hemoglobina A1c proporciona informacin sobre el control de la glucosa en la sangre durante los ltimos 3 meses.  Prueba oral de tolerancia a la glucosa (SOG). La glucosa en la sangre se mide despus de no haber comido (ayuno) durante 1-3 horas y despus de beber una bebida que contiene glucosa. Dado que las hormonas que causan la resistencia a la insulina son ms altas alrededor de las semanas 24-28 de [place], generalmente se realiza un SOG durante ese [place]. Si tiene factores de riesgo para la diabetes gestacional, su mdico puede detectarla antes de las 24 semanas de [place]. TRATAMIENTO   Usted tendr que tomar medicamentos para la diabetes o insulina diariamente para Theatre manager los niveles de glucosa en sangre en el rango deseado.  Usted tendr [employer] coincidir la dosis de insulina con el ejercicio y Marine scientist de alimentos saludables. El [place] del tratamiento es  en el rango deseado.  · Usted tendrá que hacer coincidir la dosis de insulina con el ejercicio y la elección de alimentos saludables.  El objetivo del tratamiento es mantener el nivel de glucosa en sangre en 60-99 mg / dl antes de la comida (preprandial), a la hora de acostarse y durante la noche mientras dure su embarazo. El objetivo del tratamiento es mantener el mayor pico de azúcar en sangre después de la comida (glucosa postprandial) en 100-140 mg/dl.   INSTRUCCIONES  PARA EL CUIDADO EN EL HOGAR   · Haga que su nivel de hemoglobina A1c sea verificado dos veces al año.  · Realice un control diario de glucosa en sangre según las indicaciones de su médico. Es común realizar controles con frecuencia de la glucosa en sangre.  · Supervise las cetonas en la orina cuando esté enferma y según las indicaciones de su médico.  · Tome su medicamento para la diabetes y la insulina según las indicaciones de su médico para mantener el nivel de glucosa en sangre en su rango deseado.  · Nunca se quede sin medicamentos para la diabetes o sin insulina. Es necesario recibirla todos los días.  · Ajuste la insulina sobre la base de la ingesta de hidratos de carbono. Los hidratos de carbono pueden aumentar los niveles de glucosa en la sangre, pero deben incluirse en su dieta. Los hidratos de carbono aportan vitaminas, minerales y fibra que son una parte esencial de una dieta saludable. Los hidratos de carbono se encuentran en frutas, verduras, granos enteros, productos lácteos, legumbres y alimentos que contienen azúcares añadidos.  ·   · Consuma alimentos saludables. Alterne 3 comidas con 3 colaciones.  · Mantenga un aumento de peso saludable. El aumento del peso total varía de acuerdo con el índice de masa corporal antes del embarazo (IMC).  · Lleve una tarjeta de alerta médica o lleve una pulsera de alerta médica.  · Lleve consigo un refrigerio de 15 gramos de hidrato de carbonos en todo momento para controlar los niveles bajos de glucosa en sangre (hipoglucemia). Algunos ejemplos de aperitivos de 15 gramos de hidratos de carbono son:  · Tabletas de glucosa, 3 o 4  ·   Gel de glucosa, tubo de 15 gramos  · Pasas de uva, 2 cucharadas (24 g)  · Caramelos de goma, 6  · Galletas de animales, 8  · Jugo de fruta, soda regular, o leche baja en grasa, 4 onzas (120 ml)  · Pastillas gomitas, 9  ·   · Reconocer la hipoglucemia. Durante el embarazo la hipoglucemia se produce cuando hay niveles de glucosa en  sangre de 60 mg/dl o menos. El riesgo de hipoglucemia aumenta durante el ayuno o saltarse las comidas, durante o después del ejercicio intenso, y durante el sueño. Los síntomas de hipoglucemia son:  · Temblores o sacudidas.  · Disminución de capacidad de concentración.  · Sudoración.  · Aumento en el ritmo cardíaco  · Dolor de cabeza.  · Boca seca.  · Hambre.  · Irritabilidad.  · Ansiedad.  · Sueño agitado.  · Alteración del habla o de la coordinación.  · Confusión.  · Tratar la hipoglucemia rápidamente. Si usted está alerta y puede tragar con seguridad, siga la regla de 15:15:  · Tome de 15 a 20 gramos de glucosa de acción rápida o hidratos de carbono . Las opciones de acción rápida son un gel de glucosa, unas tabletas de glucosa, o 4 onzas (120 ml) de jugo de frutas, soda   niveles de glucosa en la sangre vuelven a la normalidad.  Est alerta a la poliuria y polidipsia, que son los primeros signos de hiperglucemia. El conocimiento temprano de la hiperglucemia permite un tratamiento oportuno. Controle la hiperglucemia segn le indic su mdico.  Haga actividad fsica por lo menos 30 minutos al da o como lo indique su mdico. Se recomienda diez minutos de actividad fsica cronometrados 30 minutos despus de cada comida para Chief Operating Officercontrolar los niveles de glucosa en sangre postprandial.  Ajuste su dosis de insulina y la ingesta de alimentos, segn sea necesario, si se inicia un nuevo ejercicio o deporte.  Siga su plan diario de enfermo en algn momento que no puede comer o beber como de costumbre.  Evite el tabaco y el alcohol.  Concurra regularmente a las visitas de control con el mdico.  Siga el consejo  de su mdico respecto a los controles prenatales y posteriores al parto (post-parto), a la planificacin de las comidas, al ejercicio, a los [address]amentos, a las vitaminas, a los anlisis de Royalsangre, a otras pruebas mdicas y fsicas.  Cuide diariamente la piel y los pies. Examine su piel y los pies diariamente para detectar cortes, moretones, enrojecimiento, problemas en las uas, sangrado, ampollas o llagas.  Cepllese los dientes y encas por lo menos dos veces al da y use hilo dental al menos una vez por da. Concurra regularmente a las visitas de control con el dentista.  Programe un examen de vista durante el primer trimestre de su embarazo o como lo indique su mdico.  Comparta su plan de control de diabetes en su trabajo o en la escuela.  Mantngase al da con las vacunas.  Aprenda a Dealermanejar el estrs.  Obtenga educacin continuada y ayuda para la diabetes cuando sea necesario. SOLICITE ATENCIN MDICA SI:   No puede comer alimentos o beber por ms de 6 horas.  Tiene nuseas o ha vomitado durante ms de 6 horas.  Tiene un nivel de glucosa en sangre de 200 mg/dl y Dover Corporationcetonas en la orina.  Presenta algn cambio en el estado mental.  Tiene problemas de visin.  Sufre un dolor persistente de Training and development officercabeza.  Tiene dolor o molestias en el abdomen.  Desarrolla una enfermedad grave adicional.  Tiene diarrea durante ms de 6 horas.  Ha estado enferma o ha tenido fiebre durante un par [address] y no mejora. SOLICITE ATENCIN MDICA DE INMEDIATO SI:   Tiene dificultad para respirar.  Ya no siente los movimientos del beb.  Est sangrando o tiene flujo vaginal.  Comienza a tener contracciones o trabajo de Port Orfordparto prematuro. ASEGRESE DE QUE:  Comprende estas instrucciones.  Controlar su enfermedad.  Solicitar ayuda de inmediato si no mejora o si empeora. Document Released: 03/03/2005 Document Revised: 02/16/2012 [place] P[place] RegionalExitCare Patient Information 2014 Beaver MeadowsExitCare, MarylandLLC.  Lactancia  materna (Breastfeeding) Decidir Museum/gallery exhibitions officeramamantar es una de las mejores elecciones que puede hacer por usted y su beb. El cambio hormonal durante el Psychiatristembarazo produce el desarrollo del tejido mamario y Lesothoaumenta la cantidad y el tamao de los conductos galactforos. Estas hormonas tambin permiten que las protenas, los azcares y las grasas de la sangre produzcan la WPS Resourcesleche materna en las glndulas productoras de Washingtonleche. Las hormonas impiden que la leche materna sea liberada antes del nacimiento del beb, adems de impulsar el flujo de leche luego del nacimiento. Una vez que ha comenzado a Museum/gallery exhibitions officeramamantar, Conservation officer, naturepensar en el beb, as Immunologistcomo la succin o Theatre managerel llanto, pueden estimular la liberacin de Altonleche de las glndulas productoras de  leche.  LOS BENEFICIOS DE Verdell Carmine Para el beb  La primera leche (calostro) ayuda al mejor funcionamiento del sistema digestivo del beb.  La leche tiene anticuerpos que ayudan a Chemical engineer las infecciones en el beb.  El beb tiene una menor incidencia de asma, alergias y del sndrome de muerte sbita del lactante.  Los nutrientes en la [place] materna son mejores para el beb que la Alto maternizada y estn preparados exclusivamente para cubrir las necesidades del beb.  La leche materna mejora el desarrollo cerebral del beb.  Es menos probable que el beb desarrolle otras enfermedades, como obesidad infantil, asma o diabetes mellitus de tipo 2. Para usted   La lactancia materna favorece el desarrollo de un vnculo muy especial entre la madre y el beb.  Es conveniente. Siempre est disponible a la temperatura correcta y es [place].  La lactancia materna ayuda a quemar caloras y a perder el peso ganado durante el [place].  Favorece la contraccin del tero al tamao que tena antes del embarazo de manera ms rpida y disminuye el sangrado (loquios) despus del parto.  La lactancia materna contribuye a reducir Catering manager de desarrollar diabetes mellitus de tipo 2, osteoporosis o  cncer de mama o de ovario en el futuro. SIGNOS DE QUE EL BEB EST HAMBRIENTO Primeros signos de hambre  Aumenta su estado de [place].  Se estira.  Mueve la cabeza de un lado a otro.  Mueve la cabeza y abre la boca cuando se le toca la mejilla o la comisura de la boca (reflejo de bsqueda).  [place] vocalizaciones, tales como sonidos de succin, se relame los labios, emite arrullos, suspiros, o chirridos.  Mueve la [employer] boca.  Se chupa con ganas los dedos o las manos. Signos tardos de [employer].  Llora de manera intermitente. Signos de [employer]oles[employer] signos de hambre extrema requerirn que lo calme y lo consuele antes de que el beb pueda alimentarse adecuadamente. No espere a que se manifiesten los siguientes signos de hambre extrema para comenzar a Economist:   Air cabin crew.  Llanto intenso y fuerte.   Gritos. INFORMACIN BSICA SOBRE LA LACTANCIA MATERNA Iniciacin de la lactancia materna  Encuentre un lugar cmodo para sentarse o acostarse, con un buen respaldo para el cuello y la espalda.  Coloque una almohada o una manta enrollada debajo del beb para acomodarlo a la altura de la mama (si est sentada). Las almohadas para Economist se han diseado especialmente a fin de servir de apoyo para los brazos y el beb Kellogg.  Asegrese de que el abdomen del beb est frente al suyo.  Masajee suavemente la mama. Con las yemas de los dedos, masajee la pared del pecho hacia el pezn en un movimiento circular. Esto estimula el flujo de [place]. Es posible que Oceanographer este movimiento mientras amamanta si la leche fluye lentamente.  Sostenga la mama con el pulgar por arriba del pezn y los otros 4 dedos por debajo de la mama. Asegrese de que los dedos se encuentren lejos del pezn y de la boca del beb.  Empuje suavemente los labios del beb con el pezn o con el dedo.  Cuando la boca del beb se abra lo suficiente,  acrquelo rpidamente a la mama e introduzca todo el pezn y la zona oscura que lo rodea (areola), tanto como sea posible, dentro de la boca del beb.  Debe haber ms areola visible por arriba del labio superior del beb que  por debajo del labio inferior.  La lengua del beb debe estar entre la enca inferior y la Freelandmama.  Asegrese de que la boca del beb est en la posicin correcta alrededor del pezn (prendida). Los labios del beb deben crear un sello sobre la mama, doblndose hacia afuera (invertidos).  Es comn que el beb succione durante 2 a 3 minutos para que comience el flujo de Fountain Hillsleche materna. Cmo debe prenderse Es muy importante que le ensee al beb cmo prenderse adecuadamente a la mama. Si el beb no se prende adecuadamente, puede causarle dolor en el pezn y reducir la produccin de Nivervilleleche materna, y hacer que el beb tenga un escaso aumento de Collinsvillepeso. Adems, si el beb no se prende adecuadamente al pezn, puede tragar aire durante la alimentacin. Esto puede causarle molestias al beb. Hacer eructar al beb al Pilar Platecambiar de mama puede ayudarlo a liberar el aire. Sin embargo, ensearle al beb cmo prenderse a la mama adecuadamente es la mejor manera de evitar que se sienta molesto por tragar Oceanographeraire mientras se alimenta. Signos de que el beb se ha prendido adecuadamente al pezn:   Payton Doughtyironea o succiona de modo silencioso, sin causarle dolor.  Se escucha que traga cada 3 o 4 succiones.   Hay movimientos musculares por arriba y por delante de sus odos al Printmakersuccionar. Signos de que el beb no se ha prendido Audiological scientistadecuadamente al pezn:   Hace ruidos de succin o de chasquido mientras se alimenta.  Dolor en el pezn. Si cree que el beb no se prendi correctamente, deslice el dedo en la comisura de la boca y Ameren Corporationcolquelo entre las encas del beb para interrumpir la succin. Intente comenzar a amamantar nuevamente. Signos de Fish farm managerlactancia materna exitosa Signos del beb:   Disminucin gradual en el  nmero de succiones o cese completo de la succin.  Se duerme.  Relaja el cuerpo.  Retiene una pequea cantidad de Iolaleche en su boca.  Se desprende solo del pecho. Signos que presenta usted:  Las mamas han aumentado la firmeza, el peso y el tamao 1 a 3 horas despus de Museum/gallery exhibitions officeramamantar.  Estn ms blandas inmediatamente despus de amamantar.  Un aumento del volumen de Fairfieldleche, y tambin el cambio de su consistencia y color se producen hacia el quinto da de Tour managerlactancia materna.  Los pezones no duelen, ni estn agrietados ni sangran. Signos de que su beb recibe la cantidad de leche suficiente  Moja al menos 3 paales en 24 horas. La orina debe ser clara y de color amarillo plido a los 5 [address]  [address] de [place]vida.  Defeca al menos 3 veces en 24 horas a los [address]  [address] de [address]. La materia fecal debe ser blanda y Hartletonamarillenta.  Defeca al menos 3 veces en 24 horas a los [address] das de [address]. La materia fecal debe ser grumosa y Poundamarillenta.  No registra una prdida de peso mayor del 10% del peso al nacer durante los primeros 3 [address]  [address] de [place]vida.  Aumenta de peso un promedio de 4 a 7onzas (120 a 210ml) por semana despus de los 4 [address]  [address] de vida.  Aumenta de West Portsmouthpeso, Westgatediariamente, de Cromwellmanera consistente a Glass blower/designerpartir de los 5 [address]  [address] de vida, sin Passenger transport managerregistrar prdida de peso despus de las 2 semanas de vida. Despus de alimentarse, es posible que el beb regurgite una pequea cantidad. Esto es frecuente. FRECUENCIA Y DURACIN DE LA LACTANCIA MATERNA El amamantamiento frecuente la ayudar a producir ms Azerbaijanleche y a Education officer, communityprevenir problemas de Engineer, miningdolor en los pezones e hinchazn  en las mamas. Alimente al beb cuando muestre signos de hambre o si siente la necesidad de reducir la congestin de las [place]. Esto se denomina "lactancia a demanda". Evite el uso del chupete mientras trabaja para establecer la lactancia (las primeras 4 a 6 semanas despus del nacimiento del beb). Despus de este perodo, podr ofrecerle un chupete. Las investigaciones demostraron  que el uso del chupete durante el primer ao de vida del beb disminuye el riesgo de desarrollar el sndrome de muerte sbita del lactante (SMSL). Permita que el nio se alimente en cada mama todo lo que desee. Contine amamantando al beb hasta que haya terminado de alimentarse. Cuando el beb se desprende o se queda dormido mientras se est alimentando de la primera mama, ofrzcale la segunda. Debido a que, con frecuencia, los recin [place]oco las primeras semanas de vida, es posible que deba despertar a su beb para alimentarlo. Los horarios de Acupuncturist de un beb a otro. Sin embargo, las siguientes reglas pueden servir como gua para ayudarle a Lawyer que el beb se alimenta adecuadamente:  Se puede amamantar a los recin nacidos (bebs de 4 semanas o menos de vida) cada 1 a 3 horas.  No deben transcurrir ms de 3 horas durante el da o 5 horas durante la noche sin que se amamante a los recin nacidos.  Debe amamantar al beb 8 veces como mnimo, en un perodo de 24 horas, hasta que comience a introducir slidos en su dieta, a los 6 meses de vida aproximadamente. EXTRACCIN DE [employer] Foods Company MATERNA La extraccin y Contractor de la leche materna le permiten asegurarse de que el beb se alimente exclusivamente de [place], aun en momentos en los que no puede amamantar. Esto tiene especial importancia si debe regresar al Aleen Campi en el perodo en que an est amamantando o si no puede estar presente en los momentos en que el beb debe alimentarse. Su asesor en lactancia puede orientarla sobre cunto tiempo es seguro almacenar [place].  El sacaleche es un aparato que le permite extraer leche de la mama a un recipiente estril. Luego, la leche materna extrada puede almacenarse en un refrigerador o freezer. Algunos sacaleches son Birdie Riddle, Delaney Meigs otros son elctricos. Consulte a su asesor en lactancia qu tipo ser ms conveniente para usted. Los sacaleches  se pueden comprar, sin embargo, algunos hospitales y grupos de apoyo a la lactancia materna alquilan Sports coach. Un asesor en lactancia puede ensearle cmo extraer W. [employer]kley, en caso de que prefiera no usar un sacaleche.  CMO CUIDAR LAS MAMAS DURANTE LA LACTANCIA MATERNA Los pezones se secan, agrietan y duelen durante la Tour manager. Las siguientes recomendaciones pueden ayudarle a Pharmacologist las TEPPCO Partners y sanas:  Careers information officer usar jabn en los pezones.  Use un sostn de soporte. Aunque no son esenciales, las camisetas sin mangas o los sostenes especiales para Museum/gallery exhibitions officer estn diseados para acceder fcilmente a las mamas, para Museum/gallery exhibitions officer sin tener que quitarse todo el sostn o la camiseta. Evite usar sostenes con aro o sostenes muy ajustados.  Seque al aire sus pezones durante 3 a despus de amamantar al beb.  Utilice solo apsitos de Haematologist sostn para Environmental health practitioner las prdidas de [place]. La prdida de un poco de Public Service Enterprise Group tomas es normal.  Utilice lanolina sobre los pezones luego de Museum/gallery exhibitions officer. La lanolina ayuda a mantener la humedad normal de la piel. Si [place] lanolina pura, no tiene que lavarse los pezones antes de  alimentar al beb. La lanolina pura no es txica para el beb. Adems, puede extraer Cisco algunas gotas de [place] materna y Community education officer suavemente esa [employer], para que la [place] se seque al aire. Durante las primeras semanas despus de dar a luz, algunas mujeres pueden experimentar hinchazn en las mamas (congestin [place]). La congestin puede hacer que sienta las mamas pesadas, calientes y sensibles al tacto. El pico de la congestin ocurre dentro de los 3 a 5 das despus del [place]. Las siguientes recomendaciones pueden ayudarle a Public house manager la congestin:  Vace por completo las mamas al Reynolds American o Printmaker. Puede aplicar calor hmedo en las mamas (en la ducha o con toallas hmedas para manos) antes  de Economist o extraer [employer] Utilities. Esto aumenta la circulacin y [place] a que la [place]. Si el beb no vaca por completo las mamas cuando lo amamanta, extraiga la [place] restante despus de que haya finalizado.  Use un sostn ajustado (para amamantar o comn) o camiseta sin mangas durante 1 o 2 das para indicar al cuerpo que disminuya ligeramente la produccin de [place].  Aplique compresas de hielo [employer], a menos que le resulte demasiado incmodo.  Asegrese de que el beb se encuentre en la posicin correcta mientras lo alimenta. Si la congestin persiste luego de 48 horas o despus de seguir estas recomendaciones, comunquese con su mdico o un Lobbyist. RECOMENDACIONES GENERALES PARA EL CUIDADO DE LA SALUD DURANTE LA LACTANCIA MATERNA  Consuma alimentos saludables. Alterne comidas y colaciones, comiendo 3 de cada Glass blower/designer. Dado que lo que come Solectron Corporation, es posible que algunas comidas hagan que su beb se vuelva ms irritable de lo habitual. Evite comer este tipo de alimentos, si percibe que afectan de manera negativa al beb.  Beba leche, jugos de fruta y agua para Engineer, water su sed (aproximadamente [place]).  Descanse con frecuencia, reljese y tome sus vitaminas prenatales para evitar la fatiga, el estrs y la anemia.  Contine con los autocontroles de la mama.  Evite masticar y fumar tabaco.  Evite el consumo de alcohol y drogas. Algunos medicamentos, que pueden ser perjudiciales para el beb, pueden pasar a travs de la SLM Corporation. Es importante que consulte a su mdico antes de Medical sales representative, incluidos todos los medicamentos recetados y de [place], as como los suplementos vitamnicos y herbales. Puede quedar embarazada durante la lactancia. Si desea controlar la natalidad, consulte a su mdico cules son las opciones ms seguras para el beb. SOLICITE ATENCIN MDICA SI:   Usted siente que quiere dejar de Economist o  se siente frustrada con la lactancia.  Siente dolor en las mamas o en los pezones.  Sus pezones estn agrietados o Control and instrumentation engineer.  Sus pechos estn irritados, sensibles o calientes.  Tiene un rea hinchada en cualquiera de las mamas.  Siente escalofros o fiebre.  Tiene nuseas o vmitos.  Presenta una secrecin de otro lquido distinto de la leche materna de los pezones.  Sus mamas no se llenan antes de [employer] al beb para el 5. da despus del parto.  Se siente triste y deprimida.  El beb est demasiado somnoliento como para comer bien.  El beb tiene problemas para dormir.  Moja menos de 3 paales en 24 horas.  Defeca menos de 3 veces en 24 horas.  La piel del beb o la parte blanca de sus ojos est amarilla.  El beb no ha aumentado de [place] a  los 5 das de vida. SOLICITE ATENCIN MDICA DE INMEDIATO SI:   El beb est muy cansado (aletargado) y no se despierta para comer.  Le sube la fiebre sin causa. Document Released: 05/24/2005 Document Revised: 09/18/2012 [facility] Patient Information 2014 [place], [place].

## 2013-09-24 NOTE — Progress Notes (Signed)
FBS 78-98 (2 of 5 out of range) 2 hr pp 79-122 (2 are out of range) U/S for growth today 28 wk labs today TDaP today Continue meds and diet.

## 2013-09-25 LAB — HIV ANTIBODY (ROUTINE TESTING W REFLEX): HIV: NONREACTIVE

## 2013-09-25 LAB — RPR

## 2013-10-08 ENCOUNTER — Ambulatory Visit (INDEPENDENT_AMBULATORY_CARE_PROVIDER_SITE_OTHER): Payer: No Typology Code available for payment source | Admitting: Obstetrics & Gynecology

## 2013-10-08 VITALS — BP 128/81 | HR 86 | Temp 98.0°F | Wt 151.6 lb

## 2013-10-08 DIAGNOSIS — O24419 Gestational diabetes mellitus in pregnancy, unspecified control: Secondary | ICD-10-CM

## 2013-10-08 DIAGNOSIS — O99810 Abnormal glucose complicating pregnancy: Secondary | ICD-10-CM

## 2013-10-08 LAB — POCT URINALYSIS DIP (DEVICE)
Bilirubin Urine: NEGATIVE
GLUCOSE, UA: NEGATIVE mg/dL
HGB URINE DIPSTICK: NEGATIVE
Ketones, ur: NEGATIVE mg/dL
Leukocytes, UA: NEGATIVE
Nitrite: NEGATIVE
PH: 7 (ref 5.0–8.0)
Protein, ur: NEGATIVE mg/dL
Specific Gravity, Urine: 1.015 (ref 1.005–1.030)
UROBILINOGEN UA: 0.2 mg/dL (ref 0.0–1.0)

## 2013-10-08 NOTE — Patient Instructions (Signed)
Tercer trimestre del embarazo  (Third Trimester of Pregnancy) El tercer trimestre del embarazo abarca desde la semana 29 hasta la semana 42, desde el 7 mes hasta el 9. En este trimestre el feto se desarrolla muy rpidamente. Hacia el final del noveno mes, el beb que an no ha nacido mide alrededor de 20 pulgadas (45 cm) de largo y pesa entre 6 y 10 libras (2,700 y 4,500 kg).  CAMBIOS CORPORALES  Su organismo atravesar numerosos cambios durante el embarazo. Los cambios varan de una mujer a otra.   Seguir aumentando de peso. Es esperable que aumente entre 25 y 35 libras (11 16 kg) hacia el final del embarazo.  Podrn aparecer las primeras estras en las caderas, abdomen y mamas.  Tendr necesidad de orinar con ms frecuencia porque el feto baja hacia la pelvis y presiona en la vejiga.  Como consecuencia del embarazo, podr sentir acidez estomacal continuamente.  Podr estar constipada ya que ciertas hormonas hacen que los msculos que hacen progresar los desechos a travs de los intestinos trabajen ms lentamente.  Pueden aparecer hemorroides o abultarse e hincharse las venas (venas varicosas).  Podr sentir dolor plvico debido al aumento de peso ya que las hormonas del embarazo relajan las articulaciones entre los huesos de la pelvis. El dolor de espalda puede ser consecuencia de la exigencia de los msculos que soportan la postura.  Sus mamas seguirn desarrollndose y estarn ms sensibles. A veces sale una secrecin amarilla de las mamas, que se llama calostro.  El ombligo puede salir hacia afuera.  Podr sentir que le falta el aire debido a que se expande el tero.  Podr notar que el feto "baja" o que se siente ms bajo en el abdomen.  Podr tener una prdida de secrecin mucosa con sangre. Esto suele ocurrir entre unos pocos das y una semana antes del parto.  El cuello se vuelve delgado y blando (se borra) cerca de la fecha de parto. QU DEBE ESPERAR EN LAS CONSULTAS  PRENATALES  Le harn exmenes prenatales cada 2 semanas hasta la semana 36. A partir de ese momento le harn exmenes semanales. Durante una visita prenatal de rutina:   La pesarn para verificar que usted y el feto se encuentran dentro de los lmites normales.  Le tomarn la presin arterial.  Le medirn el abdomen para verificar el desarrollo del beb.  Escucharn los latidos fetales.  Se evaluarn los resultados de los estudios solicitados en visitas anteriores.  Le controlarn el cuello del tero cuando est prxima la fecha de parto para ver si se ha borrado. Alrededor de la semana 36 el mdico controlar el cuello del tero. Al mismo tiempo realizar un anlisis de las secreciones del tejido vaginal. Este examen es para determinar si hay un tipo de bacteria, estreptococo Grupo B. El mdico le explicar esto con ms detalle.  El mdico podr preguntarle:   Como le gustara que fuera el parto.  Cmo se siente.  Si siente los movimientos del beb.  Si tiene sntomas anormales, como prdida de lquido, sangrado, dolores de cabeza intenso o clicos abdominales.  Si tiene alguna duda. Otros estudios que podrn realizarse durante el tercer trimestre son:   Anlisis de sangre para controlar sus niveles de hierro (anemia).  Controles fetales para determinar su salud, el nivel de actividad y su desarrollo. Si tiene alguna enfermedad o si tuvo problemas durante el embarazo, le harn estudios. FALSO TRABAJO DE PARTO  Es posible que sienta contracciones pequeas e irregulares que finalmente   desaparecen. Se llaman contracciones de Braxton Hicks o falso trabajo de parto. Las contracciones pueden durar horas, das o an semanas antes de que el verdadero trabajo de parto se inicie. Si las contracciones tienen intervalos regulares, se intensifican o se hacen dolorosas, lo mejor es que la revise su mdico.  SIGNOS DE TRABAJO DE PARTO   Espasmos del tipo menstrual.  Contracciones cada 5  minutos o menos.  Contracciones que comienzan en la parte superior del tero y se expanden hacia abajo, a la zona inferior del abdomen y la espalda.  Sensacin de presin que aumenta en la pelvis o dolor en la espalda.  Aparece una secrecin acuosa o sanguinolenta por la vagina. Si tiene alguno de estos signos antes de la semana 37 del embarazo, llame a su mdico inmediatamente. Debe concurrir al hospital para ser controlada inmediatamente.  INSTRUCCIONES PARA EL CUIDADO EN EL HOGAR   Evite fumar, consumir hierbas, beber alcohol y utilizar frmacos que no le hayan recetado. Estas sustancias qumicas afectan la formacin y el desarrollo del beb.  Siga las indicaciones del profesional con respecto a como tomar los medicamentos. Durante el embarazo, hay medicamentos que son seguros y otros no lo son.  Realice actividad fsica slo segn las indicaciones del mdico. Sentir clicos uterinos es el mejor signo para detener la actividad fsica.  Contine haciendo comidas regulares y sanas.  Use un sostn que le brinde buen soporte si sus mamas estn sensibles.  No utilice la baera con agua caliente, baos turcos o saunas.  Colquese el cinturn de seguridad cuando conduzca.  Evite comer carne cruda queso sin cocinar y el contacto con los utensilios y desperdicios de los gatos. Estos elementos contienen grmenes que pueden causar defectos de nacimiento en el beb.  Tome las vitaminas indicadas para la etapa prenatal.  Pruebe un laxante (si el mdico la autoriza) si tiene constipacin. Consuma ms alimentos ricos en fibra, como vegetales y frutas frescos y cereales enteros. Beba gran cantidad de lquido para mantener la orina de tono claro o amarillo plido.  Tome baos de agua tibia para calmar el dolor o las molestias causadas por las hemorroides. Use una crema para las hemorroides si el mdico la autoriza.  Si tiene venas varicosas, use medias de soporte. Eleve los pies durante 15 minutos,  3 4 veces por da. Limite el consumo de sal en su dieta.  Evite levantar objetos pesados, use zapatos de tacones bajos y mantenga una buena postura.  Descanse con las piernas elevadas si tiene calambres o dolor de cintura.  Visite a su dentista si no lo ha hecho durante el embarazo. Use un cepillo de dientes blando para higienizarse los dientes y use suavemente el hilo dental.  Puede continuar su vida sexual excepto que el mdico le indique otra cosa.  No haga viajes largos excepto que sea absolutamente necesario y slo con la aprobacin de su mdico.  Tome clases prenatales para entender, practicar y hacer preguntas sobre el trabajo de parto y el alumbramiento.  Haga un ensayo sobre la partida al hospital.  Prepare el bolso que llevar al hospital.  Prepare la habitacin del beb.  Contine concurriendo a todas las visitas prenatales segn las indicaciones de su mdico. SOLICITE ATENCIN MDICA SI:   No est segura si est en trabajo de parto o ha roto la bolsa de aguas.  Tiene mareos.  Siente clicos leves, presin en la pelvis o dolor persistente en el abdomen.  Tiene nuseas o vmitos persistentes.  Observa una   secrecin vaginal con mal olor.  Siente dolor al orinar. SOLICITE ATENCIN MDICA DE INMEDIATO SI:   Tiene fiebre.  Pierde lquido o sangre por la vagina.  Tiene sangrado o pequeas prdidas vaginales.  Siente dolor intenso o clicos en el abdomen.  Sube o baja de peso rpidamente.  Le falta el aire y le duele el pecho al respirar.  Sbitamente se le hincha el rostro, las manos, los tobillos, los pies o las piernas de manera extrema.  No ha sentido los movimientos del beb durante una hora.  Siente un dolor de cabeza intenso que no se alivia con medicamentos.  Su visin se modifica. Document Released: 03/03/2005 Document Revised: 01/24/2013 [employer] Patient Information 2014 [employer].  

## 2013-10-08 NOTE — Progress Notes (Signed)
Patient reports that she feels some pressure.

## 2013-10-08 NOTE — Progress Notes (Signed)
FBS mostly nl 71-92, PP 90-126, will continue present dose.

## 2013-10-22 ENCOUNTER — Ambulatory Visit (INDEPENDENT_AMBULATORY_CARE_PROVIDER_SITE_OTHER): Payer: No Typology Code available for payment source | Admitting: Family Medicine

## 2013-10-22 VITALS — BP 127/87 | HR 76 | Temp 97.5°F | Wt 152.2 lb

## 2013-10-22 DIAGNOSIS — O24419 Gestational diabetes mellitus in pregnancy, unspecified control: Secondary | ICD-10-CM

## 2013-10-22 DIAGNOSIS — O99810 Abnormal glucose complicating pregnancy: Secondary | ICD-10-CM

## 2013-10-22 LAB — POCT URINALYSIS DIP (DEVICE)
BILIRUBIN URINE: NEGATIVE
Glucose, UA: NEGATIVE mg/dL
Hgb urine dipstick: NEGATIVE
Ketones, ur: NEGATIVE mg/dL
Leukocytes, UA: NEGATIVE
NITRITE: NEGATIVE
PH: 7 (ref 5.0–8.0)
PROTEIN: NEGATIVE mg/dL
Specific Gravity, Urine: 1.015 (ref 1.005–1.030)
Urobilinogen, UA: 0.2 mg/dL (ref 0.0–1.0)

## 2013-10-22 MED ORDER — ASPIRIN 81 MG PO CHEW
81.0000 mg | CHEWABLE_TABLET | Freq: Every day | ORAL | Status: DC
Start: 2013-10-22 — End: 2013-12-11

## 2013-10-22 NOTE — Progress Notes (Signed)
NST reviewed and reactive. FBS 63-94 (1 of 14 out of range) 2 hour pp 82-203 (11 of 36 out of range)--most are < 150 Advised dietary review Add exercise

## 2013-10-22 NOTE — Patient Instructions (Signed)
Diabetes mellitus gestacional  (Gestational Diabetes Mellitus) La diabetes mellitus gestacional, ms comnmente conocida como diabetes gestacional es un tipo de diabetes que desarrollan algunas mujeres durante el Al[place]. En la diabetes gestacional, el pncreas no produce suficiente insulina (una hormona), las clulas son menos sensibles a la insulina que se produce (resistencia a la insulina), o ambos.Normalmente, la Loews Corporation azcares de los alimentos a las clulas de los tejidos. Las clulas de los tejidos [employer] azcares para Dealer. La falta de insulina o la falta de una respuesta normal a la insulina hace que el exceso de azcar se acumule en la sangre en lugar de Location manager en las clulas de los tejidos. Como resultado, se desarrollan los niveles altos de Dispensing optician (hiperglucemia). El efecto de los niveles altos de azcar (glucosa) puede causar muchas complicaciones.  FACTORES DE RIESGO Usted tiene mayor probabilidad de desarrollar diabetes gestacional si tiene antecedentes familiares de diabetes y tambin si tiene uno o ms de los siguientes factores de riesgo:   ndice de masa corporal superior a 30 (obesidad).  Embarazo previo con diabetes gestacional.  Mayor edad en el momento del [place]. Si se mantienen los niveles de Multimedia programmer en un rango normal durante el [place], las mujeres pueden tener un embarazo saludable. Si no controla bien sus niveles de glucosa en sangre, pueden tener tener riesgos usted, su beb antes de nacer (feto), el [place] de parto y [place], o el beb recin nacido.  SNTOMAS  Si se presentan sntomas, stos son similares a los sntomas que normalmente experimentar durante el embarazo. Los sntomas de la diabetes gestacional son:   Lovey Newcomer de la sed (polidipsia).  Aumento de ganas de Garment/textile technologist (poliuria).  Aumento de ganas de orinar durante la noche (nicturia).  Prdida de peso. Prdida de [employer] puede ser muy  rpida.  Infecciones frecuentes y recurrentes.  Cansancio (fatiga).  Debilidad.  Cambios en la visin, como visin borrosa.  Olor a Medical illustrator.  Dolor abdominal. DIAGNSTICO La diabetes se diagnostica cuando hay aumento de los niveles de glucosa en la [place]. El nivel de glucosa en la sangre puede controlarse en uno o ms de los siguientes anlisis de sangre:   Medicin de glucosa en sangre en ayunas. No deber comer durante al menos 8 horas antes de que se tome la [place] de [place].  Anlisis al azar de glucosa en sangre. El nivel de glucosa en sangre se controla en cualquier momento del da sin importar el momento en que haya comido.  Pruebas de glucosa de sangre de hemoglobina A1c. Un anlisis de la hemoglobina A1c proporciona informacin sobre el control de la glucosa en la sangre durante los ltimos 3 meses.  Prueba oral de tolerancia a la glucosa (SOG). La glucosa en la sangre se mide despus de no haber comido (ayuno) durante 1-3 horas y despus de beber una bebida que contiene glucosa. Dado que las hormonas que causan la resistencia a la insulina son ms altas alrededor de las semanas 24-28 de [place], generalmente se realiza un SOG durante ese [place]. Si tiene factores de riesgo para la diabetes gestacional, su mdico puede detectarla antes de las 24 semanas de [place]. TRATAMIENTO   Usted tendr que tomar medicamentos para la diabetes o insulina diariamente para Theatre manager los niveles de glucosa en sangre en el rango deseado.  Usted tendr [employer] coincidir la dosis de insulina con el ejercicio y Marine scientist de alimentos saludables. El [place] del tratamiento es  en el rango deseado.  · Usted tendrá que hacer coincidir la dosis de insulina con el ejercicio y la elección de alimentos saludables.  El objetivo del tratamiento es mantener el nivel de glucosa en sangre en 60-99 mg / dl antes de la comida (preprandial), a la hora de acostarse y durante la noche mientras dure su embarazo. El objetivo del tratamiento es mantener el mayor pico de azúcar en sangre después de la comida (glucosa postprandial) en 100-140 mg/dl.   INSTRUCCIONES  PARA EL CUIDADO EN EL HOGAR   · Haga que su nivel de hemoglobina A1c sea verificado dos veces al año.  · Realice un control diario de glucosa en sangre según las indicaciones de su médico. Es común realizar controles con frecuencia de la glucosa en sangre.  · Supervise las cetonas en la orina cuando esté enferma y según las indicaciones de su médico.  · Tome su medicamento para la diabetes y la insulina según las indicaciones de su médico para mantener el nivel de glucosa en sangre en su rango deseado.  · Nunca se quede sin medicamentos para la diabetes o sin insulina. Es necesario recibirla todos los días.  · Ajuste la insulina sobre la base de la ingesta de hidratos de carbono. Los hidratos de carbono pueden aumentar los niveles de glucosa en la sangre, pero deben incluirse en su dieta. Los hidratos de carbono aportan vitaminas, minerales y fibra que son una parte esencial de una dieta saludable. Los hidratos de carbono se encuentran en frutas, verduras, granos enteros, productos lácteos, legumbres y alimentos que contienen azúcares añadidos.  ·   · Consuma alimentos saludables. Alterne 3 comidas con 3 colaciones.  · Mantenga un aumento de peso saludable. El aumento del peso total varía de acuerdo con el índice de masa corporal antes del embarazo (IMC).  · Lleve una tarjeta de alerta médica o lleve una pulsera de alerta médica.  · Lleve consigo un refrigerio de 15 gramos de hidrato de carbonos en todo momento para controlar los niveles bajos de glucosa en sangre (hipoglucemia). Algunos ejemplos de aperitivos de 15 gramos de hidratos de carbono son:  · Tabletas de glucosa, 3 o 4  ·   Gel de glucosa, tubo de 15 gramos  · Pasas de uva, 2 cucharadas (24 g)  · Caramelos de goma, 6  · Galletas de animales, 8  · Jugo de fruta, soda regular, o leche baja en grasa, 4 onzas (120 ml)  · Pastillas gomitas, 9  ·   · Reconocer la hipoglucemia. Durante el embarazo la hipoglucemia se produce cuando hay niveles de glucosa en  sangre de 60 mg/dl o menos. El riesgo de hipoglucemia aumenta durante el ayuno o saltarse las comidas, durante o después del ejercicio intenso, y durante el sueño. Los síntomas de hipoglucemia son:  · Temblores o sacudidas.  · Disminución de capacidad de concentración.  · Sudoración.  · Aumento en el ritmo cardíaco  · Dolor de cabeza.  · Boca seca.  · Hambre.  · Irritabilidad.  · Ansiedad.  · Sueño agitado.  · Alteración del habla o de la coordinación.  · Confusión.  · Tratar la hipoglucemia rápidamente. Si usted está alerta y puede tragar con seguridad, siga la regla de 15:15:  · Tome de 15 a 20 gramos de glucosa de acción rápida o hidratos de carbono . Las opciones de acción rápida son un gel de glucosa, unas tabletas de glucosa, o 4 onzas (120 ml) de jugo de frutas, soda   niveles de glucosa en la sangre vuelven a la normalidad.  Est alerta a la poliuria y polidipsia, que son los primeros signos de hiperglucemia. El conocimiento temprano de la hiperglucemia permite un tratamiento oportuno. Controle la hiperglucemia segn le indic su mdico.  Haga actividad fsica por lo menos 30 minutos al da o como lo indique su mdico. Se recomienda diez minutos de actividad fsica cronometrados 30 minutos despus de cada comida para controlar los niveles de glucosa en sangre postprandial.  Ajuste su dosis de insulina y la ingesta de alimentos, segn sea necesario, si se inicia un nuevo ejercicio o deporte.  Siga su plan diario de enfermo en algn momento que no puede comer o beber como de costumbre.  Evite el tabaco y el alcohol.  Concurra regularmente a las visitas de control con el mdico.  Siga el consejo  de su mdico respecto a los controles prenatales y posteriores al parto (post-parto), a la planificacin de las comidas, al ejercicio, a los medicamentos, a las vitaminas, a los anlisis de sangre, a otras pruebas mdicas y fsicas.  Cuide diariamente la piel y los pies. Examine su piel y los pies diariamente para detectar cortes, moretones, enrojecimiento, problemas en las uas, sangrado, ampollas o llagas.  Cepllese los dientes y encas por lo menos dos veces al da y use hilo dental al menos una vez por da. Concurra regularmente a las visitas de control con el dentista.  Programe un examen de vista durante el primer trimestre de su embarazo o como lo indique su mdico.  Comparta su plan de control de diabetes en su trabajo o en la escuela.  Mantngase al da con las vacunas.  Aprenda a manejar el estrs.  Obtenga educacin continuada y ayuda para la diabetes cuando sea necesario. SOLICITE ATENCIN MDICA SI:   No puede comer alimentos o beber por ms de 6 horas.  Tiene nuseas o ha vomitado durante ms de 6 horas.  Tiene un nivel de glucosa en sangre de 200 mg/dl y cetonas en la orina.  Presenta algn cambio en el estado mental.  Tiene problemas de visin.  Sufre un dolor persistente de cabeza.  Tiene dolor o molestias en el abdomen.  Desarrolla una enfermedad grave adicional.  Tiene diarrea durante ms de 6 horas.  Ha estado enferma o ha tenido fiebre durante un par de das y no mejora. SOLICITE ATENCIN MDICA DE INMEDIATO SI:   Tiene dificultad para respirar.  Ya no siente los movimientos del beb.  Est sangrando o tiene flujo vaginal.  Comienza a tener contracciones o trabajo de parto prematuro. ASEGRESE DE QUE:  Comprende estas instrucciones.  Controlar su enfermedad.  Solicitar ayuda de inmediato si no mejora o si empeora. Document Released: 03/03/2005 Document Revised: 02/16/2012 ExitCare Patient Information 2014 ExitCare, LLC.  Lactancia  materna (Breastfeeding) Decidir amamantar es una de las mejores elecciones que puede hacer por usted y su beb. El cambio hormonal durante el embarazo produce el desarrollo del tejido mamario y aumenta la cantidad y el tamao de los conductos galactforos. Estas hormonas tambin permiten que las protenas, los azcares y las grasas de la sangre produzcan la leche materna en las glndulas productoras de leche. Las hormonas impiden que la leche materna sea liberada antes del nacimiento del beb, adems de impulsar el flujo de leche luego del nacimiento. Una vez que ha comenzado a amamantar, pensar en el beb, as como la succin o el llanto, pueden estimular la liberacin de leche de las glndulas productoras de   leche.  LOS BENEFICIOS DE Verdell Carmine Para el beb  La primera leche (calostro) ayuda al mejor funcionamiento del sistema digestivo del beb.  La leche tiene anticuerpos que ayudan a Chemical engineer las infecciones en el beb.  El beb tiene una menor incidencia de asma, alergias y del sndrome de muerte sbita del lactante.  Los nutrientes en la [place] materna son mejores para el beb que la [place] maternizada y estn preparados exclusivamente para cubrir las necesidades del beb.  La leche materna mejora el desarrollo cerebral del beb.  Es menos probable que el beb desarrolle otras enfermedades, como obesidad infantil, asma o diabetes mellitus de tipo 2. Para usted   La lactancia materna favorece el desarrollo de un vnculo muy especial entre la madre y el beb.  Es conveniente. Siempre est disponible a la temperatura correcta y es [place].  La lactancia materna ayuda a quemar caloras y a perder el peso ganado durante el [place].  Favorece la contraccin del tero al tamao que tena antes del embarazo de manera ms rpida y disminuye el sangrado (loquios) despus del parto.  La lactancia materna contribuye a reducir Catering manager de desarrollar diabetes mellitus de tipo 2, osteoporosis o  cncer de mama o de ovario en el futuro. SIGNOS DE QUE EL BEB EST HAMBRIENTO Primeros signos de hambre  Aumenta su estado de [place].  Se estira.  Mueve la cabeza de un lado a otro.  Mueve la cabeza y abre la boca cuando se le toca la mejilla o la comisura de la boca (reflejo de bsqueda).  [place] vocalizaciones, tales como sonidos de succin, se relame los labios, emite arrullos, suspiros, o chirridos.  Mueve la [employer] boca.  Se chupa con ganas los dedos o las manos. Signos tardos de [employer].  Llora de manera intermitente. Signos de [employer] Wholes[employer] signos de hambre extrema requerirn que lo calme y lo consuele antes de que el beb pueda alimentarse adecuadamente. No espere a que se manifiesten los siguientes signos de hambre extrema para comenzar a Economist:   Air cabin crew.  Llanto intenso y fuerte.   Gritos. INFORMACIN BSICA SOBRE LA LACTANCIA MATERNA Iniciacin de la lactancia materna  Encuentre un lugar cmodo para sentarse o acostarse, con un buen respaldo para el cuello y la espalda.  Coloque una almohada o una manta enrollada debajo del beb para acomodarlo a la altura de la mama (si est sentada). Las almohadas para Economist se han diseado especialmente a fin de servir de apoyo para los brazos y el beb Kellogg.  Asegrese de que el abdomen del beb est frente al suyo.  Masajee suavemente la mama. Con las yemas de los dedos, masajee la pared del pecho hacia el pezn en un movimiento circular. Esto estimula el flujo de [place]. Es posible que Oceanographer este movimiento mientras amamanta si la leche fluye lentamente.  Sostenga la mama con el pulgar por arriba del pezn y los otros 4 dedos por debajo de la mama. Asegrese de que los dedos se encuentren lejos del pezn y de la boca del beb.  Empuje suavemente los labios del beb con el pezn o con el dedo.  Cuando la boca del beb se abra lo suficiente,  acrquelo rpidamente a la mama e introduzca todo el pezn y la zona oscura que lo rodea (areola), tanto como sea posible, dentro de la boca del beb.  Debe haber ms areola visible por arriba del labio superior del beb que  por debajo del labio inferior.  La lengua del beb debe estar entre la enca inferior y la Freelandmama.  Asegrese de que la boca del beb est en la posicin correcta alrededor del pezn (prendida). Los labios del beb deben crear un sello sobre la mama, doblndose hacia afuera (invertidos).  Es comn que el beb succione durante 2 a 3 minutos para que comience el flujo de Fountain Hillsleche materna. Cmo debe prenderse Es muy importante que le ensee al beb cmo prenderse adecuadamente a la mama. Si el beb no se prende adecuadamente, puede causarle dolor en el pezn y reducir la produccin de Nivervilleleche materna, y hacer que el beb tenga un escaso aumento de [place]villepeso. Adems, si el beb no se prende adecuadamente al pezn, puede tragar aire durante la alimentacin. Esto puede causarle molestias al beb. Hacer eructar al beb al Pilar Platecambiar de mama puede ayudarlo a liberar el aire. Sin embargo, ensearle al beb cmo prenderse a la mama adecuadamente es la mejor manera de evitar que se sienta molesto por tragar Oceanographeraire mientras se alimenta. Signos de que el beb se ha prendido adecuadamente al pezn:   Payton Doughtyironea o succiona de modo silencioso, sin causarle dolor.  Se escucha que traga cada 3 o 4 succiones.   Hay movimientos musculares por arriba y por delante de sus odos al Printmakersuccionar. Signos de que el beb no se ha prendido Audiological scientistadecuadamente al pezn:   Hace ruidos de succin o de chasquido mientras se alimenta.  Dolor en el pezn. Si cree que el beb no se prendi correctamente, deslice el dedo en la comisura de la boca y Ameren Corporationcolquelo entre las encas del beb para interrumpir la succin. Intente comenzar a amamantar nuevamente. Signos de Fish farm managerlactancia materna exitosa Signos del beb:   Disminucin gradual en el  nmero de succiones o cese completo de la succin.  Se duerme.  Relaja el cuerpo.  Retiene una pequea cantidad de Iolaleche en su boca.  Se desprende solo del pecho. Signos que presenta usted:  Las mamas han aumentado la firmeza, el peso y el tamao 1 a 3 horas despus de Museum/gallery exhibitions officeramamantar.  Estn ms blandas inmediatamente despus de amamantar.  Un aumento del volumen de Fairfieldleche, y tambin el cambio de su consistencia y color se producen hacia el quinto da de Tour managerlactancia materna.  Los pezones no duelen, ni estn agrietados ni sangran. Signos de que su beb recibe la cantidad de leche suficiente  Moja al menos 3 paales en 24 horas. La orina debe ser clara y de color amarillo plido a los 5 [address]  [address] de [place]ida.  Defeca al menos 3 veces en 24 horas a los [address]  [address] de [address]. La materia fecal debe ser blanda y Hartletonamarillenta.  Defeca al menos 3 veces en 24 horas a los [address] das de [address]. La materia fecal debe ser grumosa y Poundamarillenta.  No registra una prdida de peso mayor del 10% del peso al nacer durante los primeros 3 [address]  [address] de [place]ida.  Aumenta de peso un promedio de 4 a 7onzas (120 a 210ml) por semana despus de los [address]  [address] de vida.  Aumenta de West Portsmouthpeso, Westgatediariamente, de Cromwellmanera consistente a Glass blower/designerpartir de los 5 [address]  [address] de vida, sin Passenger transport managerregistrar prdida de peso despus de las 2 semanas de vida. Despus de alimentarse, es posible que el beb regurgite una pequea cantidad. Esto es frecuente. FRECUENCIA Y DURACIN DE LA LACTANCIA MATERNA El amamantamiento frecuente la ayudar a producir ms Azerbaijanleche y a Education officer, communityprevenir problemas de Engineer, miningdolor en los pezones e hinchazn  en las mamas. Alimente al beb cuando muestre signos de hambre o si siente la necesidad de reducir la congestin de las [place]. Esto se denomina "lactancia a demanda". Evite el uso del chupete mientras trabaja para establecer la lactancia (las primeras 4 a 6 semanas despus del nacimiento del beb). Despus de este perodo, podr ofrecerle un chupete. Las investigaciones demostraron  que el uso del chupete durante el primer ao de vida del beb disminuye el riesgo de desarrollar el sndrome de muerte sbita del lactante (SMSL). Permita que el nio se alimente en cada mama todo lo que desee. Contine amamantando al beb hasta que haya terminado de alimentarse. Cuando el beb se desprende o se queda dormido mientras se est alimentando de la primera mama, ofrzcale la segunda. Debido a que, con frecuencia, los recin [place] las primeras semanas de vida, es posible que deba despertar a su beb para alimentarlo. Los horarios de Acupuncturist de un beb a otro. Sin embargo, las siguientes reglas pueden servir como gua para ayudarle a Lawyer que el beb se alimenta adecuadamente:  Se puede amamantar a los recin nacidos (bebs de 4 semanas o menos de vida) cada 1 a 3 horas.  No deben transcurrir ms de 3 horas durante el da o 5 horas durante la noche sin que se amamante a los recin nacidos.  Debe amamantar al beb 8 veces como mnimo, en un perodo de 24 horas, hasta que comience a introducir slidos en su dieta, a los 6 meses de vida aproximadamente. EXTRACCIN DE [employer] MATERNA La extraccin y Contractor de la leche materna le permiten asegurarse de que el beb se alimente exclusivamente de [place], aun en momentos en los que no puede amamantar. Esto tiene especial importancia si debe regresar al Aleen Campi en el perodo en que an est amamantando o si no puede estar presente en los momentos en que el beb debe alimentarse. Su asesor en lactancia puede orientarla sobre cunto tiempo es seguro almacenar [place].  El sacaleche es un aparato que le permite extraer leche de la mama a un recipiente estril. Luego, la leche materna extrada puede almacenarse en un refrigerador o freezer. Algunos sacaleches son Birdie Riddle, Delaney Meigs otros son elctricos. Consulte a su asesor en lactancia qu tipo ser ms conveniente para usted. Los sacaleches  se pueden comprar, sin embargo, algunos hospitales y grupos de apoyo a la lactancia materna alquilan Sports coach. Un asesor en lactancia puede ensearle cmo extraer [employer]ley, en caso de que prefiera no usar un sacaleche.  CMO CUIDAR LAS MAMAS DURANTE LA LACTANCIA MATERNA Los pezones se secan, agrietan y duelen durante la Tour manager. Las siguientes recomendaciones pueden ayudarle a Pharmacologist las [employer]PPCO Partners y sanas:  Careers information officer usar jabn en los pezones.  Use un sostn de soporte. Aunque no son esenciales, las camisetas sin mangas o los sostenes especiales para Museum/gallery exhibitions officer estn diseados para acceder fcilmente a las mamas, para Museum/gallery exhibitions officer sin tener que quitarse todo el sostn o la camiseta. Evite usar sostenes con aro o sostenes muy ajustados.  Seque al aire sus pezones durante 3 a despus de amamantar al beb.  Utilice solo apsitos de Haematologist sostn para Environmental health practitioner las prdidas de [place]. La prdida de un poco de [employer] Enterprise Group tomas es normal.  Utilice lanolina sobre los pezones luego de Museum/gallery exhibitions officer. La lanolina ayuda a mantener la humedad normal de la piel. Si [place] lanolina pura, no tiene que lavarse los pezones antes de  alimentar al beb. La lanolina pura no es txica para el beb. Adems, puede extraer Cisco algunas gotas de [place] materna y Community education officer suavemente esa [employer], para que la [place] se seque al aire. Durante las primeras semanas despus de dar a luz, algunas mujeres pueden experimentar hinchazn en las mamas (congestin [place]). La congestin puede hacer que sienta las mamas pesadas, calientes y sensibles al tacto. El pico de la congestin ocurre dentro de los 3 a 5 das despus del [place]. Las siguientes recomendaciones pueden ayudarle a Public house manager la congestin:  Vace por completo las mamas al [employer] American o Printmaker. Puede aplicar calor hmedo en las mamas (en la ducha o con toallas hmedas para manos) antes  de Economist o extraer [employer]ities. Esto aumenta la circulacin y [place] a que la [place]. Si el beb no vaca por completo las mamas cuando lo amamanta, extraiga la [place] restante despus de que haya finalizado.  Use un sostn ajustado (para amamantar o comn) o camiseta sin mangas durante 1 o 2 das para indicar al cuerpo que disminuya ligeramente la produccin de [place].  Aplique compresas de hielo [employer], a menos que le resulte demasiado incmodo.  Asegrese de que el beb se encuentre en la posicin correcta mientras lo alimenta. Si la congestin persiste luego de 48 horas o despus de seguir estas recomendaciones, comunquese con su mdico o un Lobbyist. RECOMENDACIONES GENERALES PARA EL CUIDADO DE LA SALUD DURANTE LA LACTANCIA MATERNA  Consuma alimentos saludables. Alterne comidas y colaciones, comiendo 3 de cada Glass blower/designer. Dado que lo que come So[employer]ron Corporation, es posible que algunas comidas hagan que su beb se vuelva ms irritable de lo habitual. Evite comer este tipo de alimentos, si percibe que afectan de manera negativa al beb.  Beba leche, jugos de fruta y agua para Engineer, water su sed (aproximadamente [place]).  Descanse con frecuencia, reljese y tome sus vitaminas prenatales para evitar la fatiga, el estrs y la anemia.  Contine con los autocontroles de la mama.  Evite masticar y fumar tabaco.  Evite el consumo de alcohol y drogas. Algunos medicamentos, que pueden ser perjudiciales para el beb, pueden pasar a travs de la [employer] Corporation. Es importante que consulte a su mdico antes de Medical sales representative, incluidos todos los medicamentos recetados y de [place], as como los suplementos vitamnicos y herbales. Puede quedar embarazada durante la lactancia. Si desea controlar la natalidad, consulte a su mdico cules son las opciones ms seguras para el beb. SOLICITE ATENCIN MDICA SI:   Usted siente que quiere dejar de Economist o  se siente frustrada con la lactancia.  Siente dolor en las mamas o en los pezones.  Sus pezones estn agrietados o Control and instrumentation engineer.  Sus pechos estn irritados, sensibles o calientes.  Tiene un rea hinchada en cualquiera de las mamas.  Siente escalofros o fiebre.  Tiene nuseas o vmitos.  Presenta una secrecin de otro lquido distinto de la leche materna de los pezones.  Sus mamas no se llenan antes de Economist al beb para el 5. da despus del parto.  Se siente triste y deprimida.  El beb est demasiado somnoliento como para comer bien.  El beb tiene problemas para dormir.  Moja menos de 3 paales en 24 horas.  Defeca menos de 3 veces en 24 horas.  La piel del beb o la parte blanca de sus ojos est amarilla.  El beb no ha aumentado de [place] a  los 5 das de vida. SOLICITE ATENCIN MDICA DE INMEDIATO SI:   El beb est muy cansado (aletargado) y no se despierta para comer.  Le sube la fiebre sin causa. Document Released: 05/24/2005 Document Revised: 09/18/2012 [facility] Patient Information 2014 [place], [place].

## 2013-10-22 NOTE — Progress Notes (Signed)
Patient reports pelvic pressure.

## 2013-10-22 NOTE — Progress Notes (Signed)
US for growth scheduled 6/1

## 2013-10-26 ENCOUNTER — Ambulatory Visit (INDEPENDENT_AMBULATORY_CARE_PROVIDER_SITE_OTHER): Payer: No Typology Code available for payment source | Admitting: *Deleted

## 2013-10-26 DIAGNOSIS — O99810 Abnormal glucose complicating pregnancy: Secondary | ICD-10-CM

## 2013-10-26 DIAGNOSIS — O24419 Gestational diabetes mellitus in pregnancy, unspecified control: Secondary | ICD-10-CM

## 2013-10-26 NOTE — Progress Notes (Signed)
NST reviewed and reactive.  Kimani Bedoya L. Harraway-Smith, M.D., FACOG    

## 2013-10-30 ENCOUNTER — Ambulatory Visit (INDEPENDENT_AMBULATORY_CARE_PROVIDER_SITE_OTHER): Payer: No Typology Code available for payment source | Admitting: *Deleted

## 2013-10-30 VITALS — BP 133/82 | HR 65

## 2013-10-30 DIAGNOSIS — O99810 Abnormal glucose complicating pregnancy: Secondary | ICD-10-CM

## 2013-10-30 DIAGNOSIS — O24419 Gestational diabetes mellitus in pregnancy, unspecified control: Secondary | ICD-10-CM

## 2013-10-30 LAB — US OB FOLLOW UP

## 2013-10-31 NOTE — Progress Notes (Signed)
NST reactive on 10/30/13 

## 2013-11-02 ENCOUNTER — Ambulatory Visit (INDEPENDENT_AMBULATORY_CARE_PROVIDER_SITE_OTHER): Payer: No Typology Code available for payment source | Admitting: *Deleted

## 2013-11-02 VITALS — BP 131/76 | HR 76

## 2013-11-02 DIAGNOSIS — O99810 Abnormal glucose complicating pregnancy: Secondary | ICD-10-CM

## 2013-11-02 DIAGNOSIS — O24419 Gestational diabetes mellitus in pregnancy, unspecified control: Secondary | ICD-10-CM

## 2013-11-05 ENCOUNTER — Encounter: Payer: Self-pay | Admitting: Obstetrics and Gynecology

## 2013-11-05 ENCOUNTER — Ambulatory Visit (HOSPITAL_COMMUNITY)
Admission: RE | Admit: 2013-11-05 | Discharge: 2013-11-05 | Disposition: A | Discharge status: Home / Self Care | Payer: No Typology Code available for payment source | Source: Ambulatory Visit | Attending: Family Medicine | Admitting: Family Medicine

## 2013-11-05 ENCOUNTER — Ambulatory Visit (INDEPENDENT_AMBULATORY_CARE_PROVIDER_SITE_OTHER): Payer: No Typology Code available for payment source | Admitting: Obstetrics and Gynecology

## 2013-11-05 VITALS — BP 134/81 | HR 70 | Wt 152.6 lb

## 2013-11-05 DIAGNOSIS — O24419 Gestational diabetes mellitus in pregnancy, unspecified control: Secondary | ICD-10-CM

## 2013-11-05 DIAGNOSIS — Z3689 Encounter for other specified antenatal screening: Secondary | ICD-10-CM | POA: Insufficient documentation

## 2013-11-05 DIAGNOSIS — O99810 Abnormal glucose complicating pregnancy: Secondary | ICD-10-CM | POA: Insufficient documentation

## 2013-11-05 DIAGNOSIS — O9989 Other specified diseases and conditions complicating pregnancy, childbirth and the puerperium: Secondary | ICD-10-CM

## 2013-11-05 DIAGNOSIS — Z2839 Encounter for supervision of normal pregnancy, unspecified, unspecified trimester: Secondary | ICD-10-CM

## 2013-11-05 DIAGNOSIS — Z283 Underimmunization status: Secondary | ICD-10-CM

## 2013-11-05 LAB — POCT URINALYSIS DIP (DEVICE)
Bilirubin Urine: NEGATIVE
GLUCOSE, UA: NEGATIVE mg/dL
Hgb urine dipstick: NEGATIVE
Ketones, ur: NEGATIVE mg/dL
LEUKOCYTES UA: NEGATIVE
NITRITE: NEGATIVE
PROTEIN: NEGATIVE mg/dL
Specific Gravity, Urine: 1.015 (ref 1.005–1.030)
Urobilinogen, UA: 0.2 mg/dL (ref 0.0–1.0)
pH: 7 (ref 5.0–8.0)

## 2013-11-05 NOTE — Progress Notes (Signed)
NST today

## 2013-11-05 NOTE — Progress Notes (Signed)
Patient doing well without complaints. FM/PTL precautions reviewed Growth ultrasound done today-report not available but patient reports EFW 4lb13oz CBGs majority within range- continue glyburide regimen NST reviewed and reactive

## 2013-11-05 NOTE — Progress Notes (Signed)
Patient ID: Natasha Hendrix, female   DOB: 1980/07/03, 33 y.o.   MRN: [id number] NST 5/29 reactive

## 2013-11-09 ENCOUNTER — Ambulatory Visit (INDEPENDENT_AMBULATORY_CARE_PROVIDER_SITE_OTHER): Payer: No Typology Code available for payment source | Admitting: General Practice

## 2013-11-09 VITALS — BP 135/79 | HR 71 | Wt 152.3 lb

## 2013-11-09 DIAGNOSIS — O99810 Abnormal glucose complicating pregnancy: Secondary | ICD-10-CM

## 2013-11-12 ENCOUNTER — Ambulatory Visit (HOSPITAL_COMMUNITY)
Admission: RE | Admit: 2013-11-12 | Discharge: 2013-11-12 | Disposition: A | Discharge status: Home / Self Care | Payer: No Typology Code available for payment source | Source: Ambulatory Visit | Attending: Obstetrics & Gynecology | Admitting: Obstetrics & Gynecology

## 2013-11-12 ENCOUNTER — Ambulatory Visit (INDEPENDENT_AMBULATORY_CARE_PROVIDER_SITE_OTHER): Payer: No Typology Code available for payment source | Admitting: Obstetrics & Gynecology

## 2013-11-12 ENCOUNTER — Encounter: Payer: Self-pay | Admitting: *Deleted

## 2013-11-12 ENCOUNTER — Encounter: Payer: Self-pay | Admitting: Family Medicine

## 2013-11-12 VITALS — BP 138/83 | HR 78 | Temp 97.0°F | Wt 154.8 lb

## 2013-11-12 DIAGNOSIS — O99810 Abnormal glucose complicating pregnancy: Secondary | ICD-10-CM

## 2013-11-12 DIAGNOSIS — O24419 Gestational diabetes mellitus in pregnancy, unspecified control: Secondary | ICD-10-CM

## 2013-11-12 LAB — POCT URINALYSIS DIP (DEVICE)
BILIRUBIN URINE: NEGATIVE
Glucose, UA: NEGATIVE mg/dL
Hgb urine dipstick: NEGATIVE
Ketones, ur: NEGATIVE mg/dL
Nitrite: NEGATIVE
Protein, ur: NEGATIVE mg/dL
Specific Gravity, Urine: 1.01 (ref 1.005–1.030)
UROBILINOGEN UA: 0.2 mg/dL (ref 0.0–1.0)
pH: 7 (ref 5.0–8.0)

## 2013-11-12 LAB — FETAL NONSTRESS TEST

## 2013-11-12 NOTE — Progress Notes (Signed)
CBG mostly within nml range. A few 130s after dinner.  Continue glyuride.   Cultures next week. Continue 2x week testing. [place] at 38 1/2 weeks

## 2013-11-12 NOTE — Progress Notes (Signed)
U/S scheduled 12/06/13 at 845 am.

## 2013-11-16 ENCOUNTER — Ambulatory Visit (INDEPENDENT_AMBULATORY_CARE_PROVIDER_SITE_OTHER): Payer: No Typology Code available for payment source | Admitting: *Deleted

## 2013-11-16 VITALS — BP 128/81 | HR 77

## 2013-11-16 DIAGNOSIS — O24419 Gestational diabetes mellitus in pregnancy, unspecified control: Secondary | ICD-10-CM

## 2013-11-16 DIAGNOSIS — O99810 Abnormal glucose complicating pregnancy: Secondary | ICD-10-CM

## 2013-11-16 NOTE — Patient Instructions (Signed)
Return to clinic for any obstetric concerns or go to [facility] for evaluation  

## 2013-11-16 NOTE — Progress Notes (Signed)
Interpreter present for pt visit today.

## 2013-11-16 NOTE — Progress Notes (Signed)
NST performed today was reviewed and was found to be reactive.  Continue recommended antenatal testing and prenatal care.  

## 2013-11-19 ENCOUNTER — Ambulatory Visit (INDEPENDENT_AMBULATORY_CARE_PROVIDER_SITE_OTHER): Payer: No Typology Code available for payment source | Admitting: Obstetrics & Gynecology

## 2013-11-19 VITALS — BP 131/76 | HR 71 | Wt 154.6 lb

## 2013-11-19 DIAGNOSIS — O24419 Gestational diabetes mellitus in pregnancy, unspecified control: Secondary | ICD-10-CM

## 2013-11-19 DIAGNOSIS — O99810 Abnormal glucose complicating pregnancy: Secondary | ICD-10-CM

## 2013-11-19 LAB — US OB FOLLOW UP

## 2013-11-19 LAB — POCT URINALYSIS DIP (DEVICE)
Bilirubin Urine: NEGATIVE
Glucose, UA: NEGATIVE mg/dL
Hgb urine dipstick: NEGATIVE
Ketones, ur: NEGATIVE mg/dL
Leukocytes, UA: NEGATIVE
NITRITE: NEGATIVE
PROTEIN: NEGATIVE mg/dL
SPECIFIC GRAVITY, URINE: 1.01 (ref 1.005–1.030)
UROBILINOGEN UA: 0.2 mg/dL (ref 0.0–1.0)
pH: 7 (ref 5.0–8.0)

## 2013-11-19 LAB — OB RESULTS CONSOLE GBS: GBS: NEGATIVE

## 2013-11-19 NOTE — Progress Notes (Signed)
Patient ID: Natasha HillockRosa Rios Lopez, female   DOB: 03/23/1981, 33 y.o.   MRN: [id number] NST 11/09/13 reactive  Adam PhenixJames G Tinaya Ceballos, MD

## 2013-11-19 NOTE — Patient Instructions (Signed)
Tercer trimestre del embarazo  (Third Trimester of Pregnancy) El tercer trimestre del embarazo abarca desde la semana 29 hasta la semana 42, desde el 7 mes hasta el 9. En este trimestre el feto se desarrolla muy rpidamente. Hacia el final del noveno mes, el beb que an no ha nacido mide alrededor de 20 pulgadas (45 cm) de largo y pesa entre 6 y 10 libras (2,700 y 4,500 kg).  CAMBIOS CORPORALES  Su organismo atravesar numerosos cambios durante el embarazo. Los cambios varan de una mujer a otra.   Seguir aumentando de peso. Es esperable que aumente entre 25 y 35 libras (11 16 kg) hacia el final del embarazo.  Podrn aparecer las primeras estras en las caderas, abdomen y mamas.  Tendr necesidad de orinar con ms frecuencia porque el feto baja hacia la pelvis y presiona en la vejiga.  Como consecuencia del embarazo, podr sentir acidez estomacal continuamente.  Podr estar constipada ya que ciertas hormonas hacen que los msculos que hacen progresar los desechos a travs de los intestinos trabajen ms lentamente.  Pueden aparecer hemorroides o abultarse e hincharse las venas (venas varicosas).  Podr sentir dolor plvico debido al aumento de peso ya que las hormonas del embarazo relajan las articulaciones entre los huesos de la pelvis. El dolor de espalda puede ser consecuencia de la exigencia de los msculos que soportan la postura.  Sus mamas seguirn desarrollndose y estarn ms sensibles. A veces sale una secrecin amarilla de las mamas, que se llama calostro.  El ombligo puede salir hacia afuera.  Podr sentir que le falta el aire debido a que se expande el tero.  Podr notar que el feto "baja" o que se siente ms bajo en el abdomen.  Podr tener una prdida de secrecin mucosa con sangre. Esto suele ocurrir entre unos pocos das y una semana antes del parto.  El cuello se vuelve delgado y blando (se borra) cerca de la fecha de parto. QU DEBE ESPERAR EN LAS CONSULTAS  PRENATALES  Le harn exmenes prenatales cada 2 semanas hasta la semana 36. A partir de ese momento le harn exmenes semanales. Durante una visita prenatal de rutina:   La pesarn para verificar que usted y el feto se encuentran dentro de los lmites normales.  Le tomarn la presin arterial.  Le medirn el abdomen para verificar el desarrollo del beb.  Escucharn los latidos fetales.  Se evaluarn los resultados de los estudios solicitados en visitas anteriores.  Le controlarn el cuello del tero cuando est prxima la fecha de parto para ver si se ha borrado. Alrededor de la semana 36 el mdico controlar el cuello del tero. Al mismo tiempo realizar un anlisis de las secreciones del tejido vaginal. Este examen es para determinar si hay un tipo de bacteria, estreptococo Grupo B. El mdico le explicar esto con ms detalle.  El mdico podr preguntarle:   Como le gustara que fuera el parto.  Cmo se siente.  Si siente los movimientos del beb.  Si tiene sntomas anormales, como prdida de lquido, sangrado, dolores de cabeza intenso o clicos abdominales.  Si tiene alguna duda. Otros estudios que podrn realizarse durante el tercer trimestre son:   Anlisis de sangre para controlar sus niveles de hierro (anemia).  Controles fetales para determinar su salud, el nivel de actividad y su desarrollo. Si tiene alguna enfermedad o si tuvo problemas durante el embarazo, le harn estudios. FALSO TRABAJO DE PARTO  Es posible que sienta contracciones pequeas e irregulares que finalmente   desaparecen. Se llaman contracciones de Braxton Hicks o falso trabajo de parto. Las contracciones pueden durar horas, das o an semanas antes de que el verdadero trabajo de parto se inicie. Si las contracciones tienen intervalos regulares, se intensifican o se hacen dolorosas, lo mejor es que la revise su mdico.  SIGNOS DE TRABAJO DE PARTO   Espasmos del tipo menstrual.  Contracciones cada 5  minutos o menos.  Contracciones que comienzan en la parte superior del tero y se expanden hacia abajo, a la zona inferior del abdomen y la espalda.  Sensacin de presin que aumenta en la pelvis o dolor en la espalda.  Aparece una secrecin acuosa o sanguinolenta por la vagina. Si tiene alguno de estos signos antes de la semana 37 del embarazo, llame a su mdico inmediatamente. Debe concurrir al hospital para ser controlada inmediatamente.  INSTRUCCIONES PARA EL CUIDADO EN EL HOGAR   Evite fumar, consumir hierbas, beber alcohol y utilizar frmacos que no le hayan recetado. Estas sustancias qumicas afectan la formacin y el desarrollo del beb.  Siga las indicaciones del profesional con respecto a como tomar los medicamentos. Durante el embarazo, hay medicamentos que son seguros y otros no lo son.  Realice actividad fsica slo segn las indicaciones del mdico. Sentir clicos uterinos es el mejor signo para detener la actividad fsica.  Contine haciendo comidas regulares y sanas.  Use un sostn que le brinde buen soporte si sus mamas estn sensibles.  No utilice la baera con agua caliente, baos turcos o saunas.  Colquese el cinturn de seguridad cuando conduzca.  Evite comer carne cruda queso sin cocinar y el contacto con los utensilios y desperdicios de los gatos. Estos elementos contienen grmenes que pueden causar defectos de nacimiento en el beb.  Tome las vitaminas indicadas para la etapa prenatal.  Pruebe un laxante (si el mdico la autoriza) si tiene constipacin. Consuma ms alimentos ricos en fibra, como vegetales y frutas frescos y cereales enteros. Beba gran cantidad de lquido para mantener la orina de tono claro o amarillo plido.  Tome baos de agua tibia para calmar el dolor o las molestias causadas por las hemorroides. Use una crema para las hemorroides si el mdico la autoriza.  Si tiene venas varicosas, use medias de soporte. Eleve los pies durante 15 minutos,  3 4 veces por da. Limite el consumo de sal en su dieta.  Evite levantar objetos pesados, use zapatos de tacones bajos y mantenga una buena postura.  Descanse con las piernas elevadas si tiene calambres o dolor de cintura.  Visite a su dentista si no lo ha hecho durante el embarazo. Use un cepillo de dientes blando para higienizarse los dientes y use suavemente el hilo dental.  Puede continuar su vida sexual excepto que el mdico le indique otra cosa.  No haga viajes largos excepto que sea absolutamente necesario y slo con la aprobacin de su mdico.  Tome clases prenatales para entender, practicar y hacer preguntas sobre el trabajo de parto y el alumbramiento.  Haga un ensayo sobre la partida al hospital.  Prepare el bolso que llevar al hospital.  Prepare la habitacin del beb.  Contine concurriendo a todas las visitas prenatales segn las indicaciones de su mdico. SOLICITE ATENCIN MDICA SI:   No est segura si est en trabajo de parto o ha roto la bolsa de aguas.  Tiene mareos.  Siente clicos leves, presin en la pelvis o dolor persistente en el abdomen.  Tiene nuseas o vmitos persistentes.  Observa una   secrecin vaginal con mal olor.  Siente dolor al orinar. SOLICITE ATENCIN MDICA DE INMEDIATO SI:   Tiene fiebre.  Pierde lquido o sangre por la vagina.  Tiene sangrado o pequeas prdidas vaginales.  Siente dolor intenso o clicos en el abdomen.  Sube o baja de peso rpidamente.  Le falta el aire y le duele el pecho al respirar.  Sbitamente se le hincha el rostro, las manos, los tobillos, los pies o las piernas de manera extrema.  No ha sentido los movimientos del beb durante una hora.  Siente un dolor de cabeza intenso que no se alivia con medicamentos.  Su visin se modifica. Document Released: 03/03/2005 Document Revised: 01/24/2013 [employer] Patient Information 2014 [employer].  

## 2013-11-19 NOTE — Progress Notes (Signed)
NST reactive. IOL 39 weeks,  FBS<91, PP all<131

## 2013-11-22 LAB — GC/CHLAMYDIA PROBE AMP
CT PROBE, AMP APTIMA: NEGATIVE
GC Probe RNA: NEGATIVE

## 2013-11-22 LAB — CULTURE, BETA STREP (GROUP B ONLY)

## 2013-11-23 ENCOUNTER — Ambulatory Visit (INDEPENDENT_AMBULATORY_CARE_PROVIDER_SITE_OTHER): Payer: No Typology Code available for payment source | Admitting: *Deleted

## 2013-11-23 VITALS — BP 134/80 | HR 73

## 2013-11-23 DIAGNOSIS — O24419 Gestational diabetes mellitus in pregnancy, unspecified control: Secondary | ICD-10-CM

## 2013-11-23 DIAGNOSIS — O99810 Abnormal glucose complicating pregnancy: Secondary | ICD-10-CM

## 2013-11-26 ENCOUNTER — Ambulatory Visit (INDEPENDENT_AMBULATORY_CARE_PROVIDER_SITE_OTHER): Payer: No Typology Code available for payment source | Admitting: Obstetrics & Gynecology

## 2013-11-26 VITALS — BP 125/77 | HR 66 | Wt 155.2 lb

## 2013-11-26 DIAGNOSIS — O99810 Abnormal glucose complicating pregnancy: Secondary | ICD-10-CM

## 2013-11-26 DIAGNOSIS — O24419 Gestational diabetes mellitus in pregnancy, unspecified control: Secondary | ICD-10-CM

## 2013-11-26 LAB — US OB FOLLOW UP

## 2013-11-26 LAB — POCT URINALYSIS DIP (DEVICE)
BILIRUBIN URINE: NEGATIVE
Glucose, UA: NEGATIVE mg/dL
HGB URINE DIPSTICK: NEGATIVE
Ketones, ur: NEGATIVE mg/dL
Nitrite: NEGATIVE
PH: 7 (ref 5.0–8.0)
Protein, ur: NEGATIVE mg/dL
Specific Gravity, Urine: 1.01 (ref 1.005–1.030)
Urobilinogen, UA: 0.2 mg/dL (ref 0.0–1.0)

## 2013-11-26 NOTE — Progress Notes (Signed)
NST reactive. FBS <76, PP <132, no change in tx

## 2013-11-26 NOTE — Patient Instructions (Signed)
Induccin del trabajo de parto  (Labor Induction ) Se denomina induccin del trabajo de parto cuando se inician acciones para hacer que una mujer embarazada comience el trabajo de parto. La mayora de las mujeres comienzan el trabajo de parto sin ayuda entre las semanas 37 y 42 del embarazo. Cuando esto no ocurre o cuando hay una necesidad mdica, pueden utilizarse diferentes mtodos para inducirlo. La induccin del trabajo de parto hace que el tero se contraiga. Tambin hace que el cuello del tero se ablandemadure), se abra (se dilate), y se afine (se borre). Generalmente el trabajo de parto no se induce antes de las 39 semanas excepto que haya un problema con el beb o con la madre.  Antes de inducir el trabajo de parto, el mdico considerar cierto nmero de factores incluyendo los siguientes:  El estado del beb.  Cuntas semanas tiene de embarazo.  La madurez de los pulmones del beb.  El estado del cuello del tero.  La posicin del beb. CULES SON LOS MOTIVOS PARA INDUCIR UN PARTO? El trabajo de parto puede inducirse por las siguientes razones:  La salud del beb o de la madre estn en riesgo.  El embarazo se ha pasado de trmino en 1 semana o ms.  Ha roto la bolsa de aguas pero no se ha iniciado el trabajo de parto por s mismo.  La madre tiene algn trastorno de salud o una enfermedad grave, como hipertensin arterial, una infeccin, desprendimiento abrupto de la placenta o diabetes.  Hay escaso lquido amnitico alrededor del beb.  El beb presenta sufrimiento. La conveniencia o el deseo de que el beb nazca en una cierta fecha no es un motivo para inducir el parto. CULES SON LOS MTODOS UTILIZADOS PARA INDUCIR EL TRABAJO DE PARTO? Algunos mtodos de induccin del trabajo de parto son:   Administracin del medicamentos prostaglandina. Este medicamento hace que el cuello uterino se dilate y madure. Este medicamento tambin iniciar las contracciones. Puede tomarse por  boca o insertarse en la vagina en forma de supositorio.  Insercin en la vagina de un tubo delgado (catter) con un baln en el extremo para dilatar el cuello del tero. Una vez insertado, el baln se infla con agua, lo que provoca la apertura del cuello del tero.  Ruptura de las membranas. El mdico separa el saco amnitico del cuello uterino, haciendo que el cuello uterino se distienda y cause la liberacin de la hormona llamada progesterona. Esto hace que el tero se contraiga. Este procedimiento se realiza durante una visita al consultorio mdico. Le indicarn que vuelva a su casa y espere que se inicien las contracciones. Luego tendr que volver para la induccin.  Ruptura de la bolsa de aguas. El mdico romper el saco amnitico con un pequeo instrumento. Una vez que el saco amnitico se rompe, las contracciones deben comenzar. Pueden pasar algunas horas hasta que haga efecto.  Medicamentos que desencadenen o intensifiquen las contracciones. Se lo administrarn a travs de un catter por va intravenosa (IV) que se inserta en una de las venas del brazo. Todos los mtodos de induccin, excepto la ruptura de membranas, se realizan en el hospital. La induccin se realizar en el hospital, de modo que usted y el beb puedan ser controlados cuidadosamente.  CUNTO TIEMPO LLEVA INDUCIR EL TRABAJO DE PARTO? Algunas inducciones pueden demorar entre 2 y 3 das. Generalmente lleva menos tiempo, dependiendo del estado del cuello del tero. Puede tomar ms tiempo si la induccin se realiza en etapas tempranas del embarazo   o es su primer embarazo. Si han pasado 2 o 3 das y no se inicia el trabajo de parto, podrn enviarla a su casa o realizar una cesrea. CULES SON LOS RIESGOS ASOCIADOS CON LA INDUCCiN DEL TRABAJO DE PARTO? Algunos de los riesgos de la induccin son:   Cambios en la frecuencia cardaca fetal, por ejemplo los latidos son demasiado rpidos, o lentos, o errticos.  Riesgo de distrs  fetal.  Posibilidad de infeccin en la madre o el beb.  Aumento de la posibilidad de que sea necesaria una cesrea.  Ruptura (abrupcin) de la placenta del tero (raro).  Ruptura uterina (muy raro). Cuando es necesario realizar la induccin por razones mdicas, los beneficios deben superar a los riesgos. CULES SON ALGUNAS RAZONES PARA NO INDUCIR EL TRABAJO DE PARTO? La induccin no debe realizarse si:   Se demuestra que el beb no tolera el trabajo de parto.  Fue sometida anteriormente a cirugas en el tero, como una miomectoma o le han extirpado fibromas.  La placenta est en una posicin muy baja en el tero y obstruye la abertura del cuello (placenta previa).  El beb no est ubicado con la cabeza hacia bajo.  El cordn umbilical cae hacia el canal de parto, adelante del beb. Esto puede cortar el suministro de sangre y oxgeno al beb.  Fue sometida a una cesrea anteriormente.  Hay circunstancias poco habituales, como que el beb es extremadamente prematuro. Document Released: 08/31/2007 Document Revised: 01/24/2013 [employer] Patient Information 2015 [employer]. This information is not intended to replace advice given to you by your health care Eylin Pontarelli. Make sure you discuss any questions you have with your health care Antony Sian.  

## 2013-11-26 NOTE — Progress Notes (Signed)
US for growth scheduled on 7/2, IOL scheduled 7/6 @ 0730 per protocol.

## 2013-11-27 NOTE — Progress Notes (Signed)
NST reviewed and reactive.  

## 2013-11-30 ENCOUNTER — Ambulatory Visit (INDEPENDENT_AMBULATORY_CARE_PROVIDER_SITE_OTHER): Payer: No Typology Code available for payment source | Admitting: *Deleted

## 2013-11-30 VITALS — BP 132/76 | HR 65

## 2013-11-30 DIAGNOSIS — O24419 Gestational diabetes mellitus in pregnancy, unspecified control: Secondary | ICD-10-CM

## 2013-11-30 DIAGNOSIS — O99810 Abnormal glucose complicating pregnancy: Secondary | ICD-10-CM

## 2013-11-30 NOTE — Progress Notes (Signed)
NST reviewed and reactive.  Carolyn L. Harraway-Smith, M.D., FACOG    

## 2013-12-03 ENCOUNTER — Ambulatory Visit (INDEPENDENT_AMBULATORY_CARE_PROVIDER_SITE_OTHER): Payer: No Typology Code available for payment source | Admitting: Obstetrics & Gynecology

## 2013-12-03 VITALS — BP 133/81 | HR 65 | Wt 154.9 lb

## 2013-12-03 DIAGNOSIS — O99810 Abnormal glucose complicating pregnancy: Secondary | ICD-10-CM

## 2013-12-03 DIAGNOSIS — O24419 Gestational diabetes mellitus in pregnancy, unspecified control: Secondary | ICD-10-CM

## 2013-12-03 LAB — POCT URINALYSIS DIP (DEVICE)
Bilirubin Urine: NEGATIVE
Glucose, UA: NEGATIVE mg/dL
Hgb urine dipstick: NEGATIVE
KETONES UR: NEGATIVE mg/dL
Leukocytes, UA: NEGATIVE
Nitrite: NEGATIVE
PH: 7 (ref 5.0–8.0)
Protein, ur: NEGATIVE mg/dL
Specific Gravity, Urine: 1.015 (ref 1.005–1.030)
Urobilinogen, UA: 0.2 mg/dL (ref 0.0–1.0)

## 2013-12-03 NOTE — Progress Notes (Signed)
US for growth scheduled on 7/2.  IOL scheduled 7/5.

## 2013-12-03 NOTE — Progress Notes (Signed)
GDM A2/B  Fasting 70s-93; 2 hr pp break 63-107;   2 hr pp lunch  82-132 (one isolated value 223--dessert);  2 hr dinner 70-109 Induction date set for 12/10/13.   US for growth this Thursday.

## 2013-12-05 ENCOUNTER — Telehealth (HOSPITAL_COMMUNITY): Payer: Self-pay | Admitting: *Deleted

## 2013-12-05 NOTE — Telephone Encounter (Signed)
Preadmission screen [phone] interpreter number

## 2013-12-06 ENCOUNTER — Ambulatory Visit (INDEPENDENT_AMBULATORY_CARE_PROVIDER_SITE_OTHER): Payer: No Typology Code available for payment source | Admitting: General Practice

## 2013-12-06 ENCOUNTER — Ambulatory Visit (HOSPITAL_COMMUNITY)
Admission: RE | Admit: 2013-12-06 | Discharge: 2013-12-06 | Disposition: A | Discharge status: Home / Self Care | Payer: No Typology Code available for payment source | Source: Ambulatory Visit | Attending: Obstetrics & Gynecology | Admitting: Obstetrics & Gynecology

## 2013-12-06 VITALS — BP 139/79 | HR 70 | Wt 153.1 lb

## 2013-12-06 DIAGNOSIS — O99810 Abnormal glucose complicating pregnancy: Secondary | ICD-10-CM | POA: Insufficient documentation

## 2013-12-06 DIAGNOSIS — O24419 Gestational diabetes mellitus in pregnancy, unspecified control: Secondary | ICD-10-CM

## 2013-12-06 DIAGNOSIS — Z3689 Encounter for other specified antenatal screening: Secondary | ICD-10-CM | POA: Insufficient documentation

## 2013-12-06 NOTE — Progress Notes (Signed)
Patient denies leakage of fluid or ROM

## 2013-12-09 ENCOUNTER — Inpatient Hospital Stay (HOSPITAL_COMMUNITY)
Admission: RE | Admit: 2013-12-09 | Discharge: 2013-12-11 | DRG: 774 | Disposition: A | Discharge status: Home / Self Care | Payer: Medicaid Other | Source: Ambulatory Visit | Attending: Obstetrics and Gynecology | Admitting: Obstetrics and Gynecology

## 2013-12-09 ENCOUNTER — Inpatient Hospital Stay (HOSPITAL_COMMUNITY): Payer: Medicaid Other | Admitting: Anesthesiology

## 2013-12-09 ENCOUNTER — Encounter (HOSPITAL_COMMUNITY): Payer: Medicaid Other | Admitting: Anesthesiology

## 2013-12-09 ENCOUNTER — Encounter (HOSPITAL_COMMUNITY): Payer: Self-pay

## 2013-12-09 DIAGNOSIS — O99814 Abnormal glucose complicating childbirth: Secondary | ICD-10-CM | POA: Diagnosis present

## 2013-12-09 DIAGNOSIS — O1002 Pre-existing essential hypertension complicating childbirth: Secondary | ICD-10-CM | POA: Diagnosis present

## 2013-12-09 DIAGNOSIS — O4290 Premature rupture of membranes, unspecified as to length of time between rupture and onset of labor, unspecified weeks of gestation: Principal | ICD-10-CM | POA: Diagnosis present

## 2013-12-09 LAB — CBC
HCT: 39.4 % (ref 36.0–46.0)
HCT: 38.7 % (ref 36.0–46.0)
Hemoglobin: 13.7 g/dL (ref 12.0–15.0)
Hemoglobin: 14 g/dL (ref 12.0–15.0)
MCH: 31.1 pg (ref 26.0–34.0)
MCH: 32.5 pg (ref 26.0–34.0)
MCHC: 34.8 g/dL (ref 30.0–36.0)
MCHC: 36.2 g/dL — AB (ref 30.0–36.0)
MCV: 89.3 fL (ref 78.0–100.0)
MCV: 89.8 fL (ref 78.0–100.0)
PLATELETS: 164 10*3/uL (ref 150–400)
Platelets: 177 K/uL (ref 150–400)
RBC: 4.41 MIL/uL (ref 3.87–5.11)
RBC: 4.31 MIL/uL (ref 3.87–5.11)
RDW: 13.4 % (ref 11.5–15.5)
RDW: 13.2 % (ref 11.5–15.5)
WBC: 7.4 K/uL (ref 4.0–10.5)
WBC: 9.9 10*3/uL (ref 4.0–10.5)

## 2013-12-09 LAB — COMPREHENSIVE METABOLIC PANEL
ALT: 33 U/L (ref 0–35)
ANION GAP: 13 (ref 5–15)
AST: 31 U/L (ref 0–37)
Albumin: 2.8 g/dL — ABNORMAL LOW (ref 3.5–5.2)
Alkaline Phosphatase: 179 U/L — ABNORMAL HIGH (ref 39–117)
BUN: 7 mg/dL (ref 6–23)
CALCIUM: 8.9 mg/dL (ref 8.4–10.5)
CO2: 22 mEq/L (ref 19–32)
Chloride: 104 mEq/L (ref 96–112)
Creatinine, Ser: 0.53 mg/dL (ref 0.50–1.10)
GFR calc Af Amer: >90 mL/min (ref >90)
Glucose, Bld: 74 mg/dL (ref 70–99)
Potassium: 4 mEq/L (ref 3.7–5.3)
Sodium: 139 mEq/L (ref 137–147)
Total Bilirubin: 0.4 mg/dL (ref 0.3–1.2)
Total Protein: 6.8 g/dL (ref 6.0–8.3)

## 2013-12-09 LAB — HEPATITIS B SURFACE ANTIGEN: Hepatitis B Surface Ag: NEGATIVE

## 2013-12-09 LAB — GLUCOSE, CAPILLARY
GLUCOSE-CAPILLARY: 71 mg/dL (ref 70–99)
Glucose-Capillary: 61 mg/dL — ABNORMAL LOW (ref 70–99)
Glucose-Capillary: 65 mg/dL — ABNORMAL LOW (ref 70–99)
Glucose-Capillary: 57 mg/dL — ABNORMAL LOW (ref 70–99)
Glucose-Capillary: 84 mg/dL (ref 70–99)
Glucose-Capillary: 76 mg/dL (ref 70–99)

## 2013-12-09 LAB — SYPHILIS: RPR W/REFLEX TO RPR TITER AND TREPONEMAL ANTIBODIES, TRADITIONAL SCREENING AND DIAGNOSIS ALGORITHM

## 2013-12-09 LAB — ABO/RH: ABO/RH(D): O POS

## 2013-12-09 LAB — TYPE AND SCREEN
ABO/RH(D): O POS
Antibody Screen: NEGATIVE

## 2013-12-09 LAB — PROTEIN / CREATININE RATIO, URINE
Creatinine, Urine: 35.95 mg/dL
PROTEIN CREATININE RATIO: 0.21 — AB (ref 0.00–0.15)
TOTAL PROTEIN, URINE: 7.4 mg/dL

## 2013-12-09 MED ORDER — FENTANYL CITRATE 0.05 MG/ML IJ SOLN
INTRAMUSCULAR | Status: AC
  Administered 2013-12-09: 100 ug
  Filled 2013-12-09: qty 2

## 2013-12-09 MED ORDER — ACETAMINOPHEN 325 MG PO TABS
650.0000 mg | ORAL_TABLET | ORAL | Status: DC | PRN
Start: 2013-12-09 — End: 2013-12-10

## 2013-12-09 MED ORDER — OXYTOCIN BOLUS FROM INFUSION: 500.0000 mL | INTRAVENOUS | Status: DC

## 2013-12-09 MED ORDER — PHENYLEPHRINE 40 MCG/ML (10ML) SYRINGE FOR IV PUSH (FOR BLOOD PRESSURE SUPPORT)
80.0000 ug | PREFILLED_SYRINGE | INTRAVENOUS | Status: DC | PRN
  Filled 2013-12-09: qty 2
  Filled 2013-12-09: qty 10

## 2013-12-09 MED ORDER — IBUPROFEN 600 MG PO TABS: 600.0000 mg | ORAL_TABLET | Freq: Four times a day (QID) | ORAL | Status: DC | PRN

## 2013-12-09 MED ORDER — EPHEDRINE 5 MG/ML INJ
10.0000 mg | INTRAVENOUS | Status: DC | PRN
  Filled 2013-12-09: qty 2

## 2013-12-09 MED ORDER — CITRIC ACID-SODIUM CITRATE 334-500 MG/5ML PO SOLN: 30.0000 mL | ORAL | Status: DC | PRN

## 2013-12-09 MED ORDER — DIPHENHYDRAMINE HCL 50 MG/ML IJ SOLN: 12.5000 mg | INTRAMUSCULAR | Status: DC | PRN

## 2013-12-09 MED ORDER — OXYTOCIN 40 UNITS IN LACTATED RINGERS INFUSION - SIMPLE MED
1.0000 m[IU]/min | INTRAVENOUS | Status: DC
  Administered 2013-12-09: 2 m[IU]/min via INTRAVENOUS

## 2013-12-09 MED ORDER — PHENYLEPHRINE 40 MCG/ML (10ML) SYRINGE FOR IV PUSH (FOR BLOOD PRESSURE SUPPORT)
80.0000 ug | PREFILLED_SYRINGE | INTRAVENOUS | Status: DC | PRN
  Filled 2013-12-09: qty 2

## 2013-12-09 MED ORDER — LACTATED RINGERS IV SOLN: 500.0000 mL | INTRAVENOUS | Status: DC | PRN

## 2013-12-09 MED ORDER — TERBUTALINE SULFATE 1 MG/ML IJ SOLN: 0.2500 mg | Freq: Once | INTRAMUSCULAR | Status: AC | PRN

## 2013-12-09 MED ORDER — FLEET ENEMA 7-19 GM/118ML RE ENEM
1.0000 | ENEMA | RECTAL | Status: DC | PRN
Start: 2013-12-09 — End: 2013-12-10

## 2013-12-09 MED ORDER — LIDOCAINE HCL (PF) 1 % IJ SOLN
INTRAMUSCULAR | Status: DC | PRN
  Administered 2013-12-09 (×3): 4 mL

## 2013-12-09 MED ORDER — FENTANYL CITRATE 0.05 MG/ML IJ SOLN
100.0000 ug | INTRAMUSCULAR | Status: DC | PRN
  Administered 2013-12-09: 100 ug via INTRAVENOUS

## 2013-12-09 MED ORDER — LACTATED RINGERS IV SOLN
500.0000 mL | Freq: Once | INTRAVENOUS | Status: AC
  Administered 2013-12-09: 500 mL via INTRAVENOUS

## 2013-12-09 MED ORDER — ONDANSETRON HCL 4 MG/2ML IJ SOLN: 4.0000 mg | Freq: Four times a day (QID) | INTRAMUSCULAR | Status: DC | PRN

## 2013-12-09 MED ORDER — OXYCODONE-ACETAMINOPHEN 5-325 MG PO TABS: 1.0000 | ORAL_TABLET | ORAL | Status: DC | PRN

## 2013-12-09 MED ORDER — OXYTOCIN 40 UNITS IN LACTATED RINGERS INFUSION - SIMPLE MED
62.5000 mL/h | INTRAVENOUS | Status: DC
  Filled 2013-12-09: qty 1000

## 2013-12-09 MED ORDER — MISOPROSTOL 200 MCG PO TABS
50.0000 ug | ORAL_TABLET | ORAL | Status: DC
  Administered 2013-12-09: 50 ug via ORAL
  Filled 2013-12-09 (×8): qty 1

## 2013-12-09 MED ORDER — LIDOCAINE HCL (PF) 1 % IJ SOLN
30.0000 mL | INTRAMUSCULAR | Status: DC | PRN
  Filled 2013-12-09: qty 30

## 2013-12-09 MED ORDER — FENTANYL 2.5 MCG/ML BUPIVACAINE 1/10 % EPIDURAL INFUSION (WH - ANES)
14.0000 mL/h | INTRAMUSCULAR | Status: DC | PRN
  Administered 2013-12-09: 14 mL/h via EPIDURAL
  Filled 2013-12-09: qty 125

## 2013-12-09 MED ORDER — LACTATED RINGERS IV SOLN
INTRAVENOUS | Status: DC
  Administered 2013-12-09 (×3): via INTRAVENOUS

## 2013-12-09 NOTE — Anesthesia Procedure Notes (Signed)
Epidural Patient location during procedure: OB Start time: 12/09/2013 7:28 PM  Staffing Performed by: anesthesiologist   Preanesthetic Checklist Completed: patient identified, site marked, surgical consent, pre-op evaluation, timeout performed, IV checked, risks and benefits discussed and monitors and equipment checked  Epidural Patient position: sitting Prep: site prepped and draped and DuraPrep Patient monitoring: continuous pulse ox and blood pressure Approach: midline Injection technique: LOR air  Needle:  Needle type: Tuohy  Needle gauge: 17 G Needle length: 9 cm and 9 Needle insertion depth: 5 cm cm Catheter type: closed end flexible Catheter size: 19 Gauge Catheter at skin depth: 10 cm Test dose: negative  Assessment Events: blood not aspirated, injection not painful, no injection resistance, negative IV test and no paresthesia  Additional Notes Discussed risk of headache, infection, bleeding, nerve injury and failed or incomplete block.  Patient voices understanding and wishes to proceed.  Epidural placed easily on first attempt.  No paresthesia.  Patient tolerated procedure well with no apparent complications.  Jasmine DecemberA. Marquette Blodgett, MDReason for block:procedure for pain

## 2013-12-09 NOTE — H&P (Signed)
Attestation of Attending Supervision of Advanced Practitioner (CNM/NP): Evaluation and management procedures were performed by the Advanced Practitioner under my supervision and collaboration.  I have reviewed the Advanced Practitioner's note and chart, and I agree with the management and plan.  Keliah Harned 12/09/2013 5:53 PM   

## 2013-12-09 NOTE — Progress Notes (Signed)
Natasha Hendrix is a 33 y.o. [phone] at 1096w6d admitted for PROM  Subjective:  Doing well. Feeling just mild cramping right now. +FM. Still leaking clear fluid.   Objective: BP 138/82  Pulse 58  Temp(Src) 98.1 F (36.7 C) (Oral)  Resp 16  Ht 5' (1.524 m)  Wt 69.4 kg (153 lb)  BMI 29.88 kg/m2  LMP 03/12/2013      FHT:  FHR: 135 bpm, variability: moderate,  accelerations:  Present,  decelerations:  Absent UC:   irregular, every 2-7 minutes SVE:   Dilation: 3.5 Effacement (%): 50 Station: -2 Exam by:: Hendrix. Mcdaniel RN  Labs: Lab Results  Component Value Date   WBC 7.4 12/09/2013   HGB 13.7 12/09/2013   HCT 39.4 12/09/2013   MCV 89.3 12/09/2013   PLT 177 12/09/2013    Assessment / Plan: IOL for PROM  Labor: s/p cytotec and now favorable for pitocin. Will plan to start pit now 2x2 BP: now WNL- cont to moitor Fetal Wellbeing:  Category I Pain Control:  Labor support without medications I/D:  n/a Anticipated MOD:  NSVD  Natasha Hendrix 12/09/2013, 1:10 PM

## 2013-12-09 NOTE — H&P (Signed)
LABOR ADMISSION HISTORY AND PHYSICAL  Kizzie FurnishRosa Hendrix Natasha Hendrix is a 33 y.o. female [phone] with IUP at 567w6d by LMP c/w 20 week US presenting for PROM.   Pt states she woke up this AM around 6am and had a large gush of clear fluid. Since then has had no ctx, vb.  +FM.  Has continued to leak clear fluid.  Was scheduled for IOL tomorrow for A2DM on glyburide.     [facility] A2/B DM with a 1 hour of 195 at 16 weeks.  PNC has otherwise been uncomplicated. EFW 7lb13oz on 7/2.    Prenatal History/Complications:  Past Medical History: Past Medical History  Diagnosis Date  . Diabetes mellitus without complication     Past Surgical History: Past Surgical History  Procedure Laterality Date  . Carpal tunnel release Right 2014    Obstetrical History: OB History   Grav Para Term Preterm Abortions TAB SAB Ect Mult Living   4 3 3  0 0 0 0 0 0 3     G1- term, NSVD, 7lb G2- term, NSVD, 6lb G3- term, NSVD, 6lb G4- current    Social History: History   Social History  . Marital Status: Single    Spouse Name: N/A    Number of Children: N/A  . Years of Education: N/A   Social History Main Topics  . Smoking status: Never Smoker   . Smokeless tobacco: Never Used  . Alcohol Use: No  . Drug Use: No  . Sexual Activity: Yes   Other Topics Concern  . None   Social History Narrative  . None    Family History: No family history on file.  Allergies: No Known Allergies  Prescriptions prior to admission  Medication Sig Dispense Refill  . acetaminophen (TYLENOL) 325 MG tablet Take 650 mg by mouth every 6 (six) hours as needed for headache.      Marland Kitchen. aspirin (BABY ASPIRIN) 81 MG chewable tablet Chew 1 tablet (81 mg total) by mouth daily.  90 tablet  2  . glyBURIDE (DIABETA) 2.5 MG tablet Take 1 tablet (2.5 mg total) by mouth at bedtime.  30 tablet  3  . Prenatal Vit-Fe Fumarate-FA (PRENATAL VITAMINS) 28-0.8 MG TABS Take 1 tablet by mouth daily.          Review of Systems   All systems reviewed and negative except as stated in HPI  Blood pressure 151/82, pulse 67, resp. rate 16, height 5' (1.524 m), weight 69.4 kg (153 lb), last menstrual period 03/12/2013. General appearance: alert, cooperative and no distress Lungs: clear to auscultation bilaterally Heart: regular rate and rhythm Abdomen: soft, non-tender; bowel sounds normal Extremities: Homans sign is negative, no sign of DVT  Presentation: cephalic Fetal monitoringBaseline: 130 bpm, Variability: Good {> 6 bpm), Accelerations: Reactive and Decelerations: Absent Uterine activity rare  Dilation: 2 Effacement (%): Thick Station: -3 Exam by:: Lujean RaveLauren McDaniel rn   Prenatal labs: ABO, Rh: O/Positive/-- (01/26 0000) Antibody: Negative (01/26 0000) Rubella:   RPR: NON REAC (04/20 1049)  HBsAg:    HIV: NONREACTIVE (04/20 1049)  GBS: Negative (06/15 0000)     Prenatal Transfer Tool  Maternal Diabetes: Yes:  Diabetes Type:  Insulin/Medication controlled Genetic Screening: Normal Maternal Ultrasounds/Referrals: Normal Fetal Ultrasounds or other Referrals:  Fetal echo Maternal Substance Abuse:  No Significant Maternal Medications:  Meds include: Other: glyburide Significant Maternal Lab Results: None     Results for orders placed during the  hospital encounter of 12/09/13 (from the past 24 hour(s))  GLUCOSE, CAPILLARY   Collection Time    12/09/13  7:52 AM      Result Value Ref Range   Glucose-Capillary 61 (*) 70 - 99 mg/dL  CBC   Collection Time    12/09/13  7:55 AM      Result Value Ref Range   WBC 7.4  4.0 - 10.5 K/uL   RBC 4.41  3.87 - 5.11 MIL/uL   Hemoglobin 13.7  12.0 - 15.0 g/dL   HCT 16.139.4  09.636.0 - 04.546.0 %   MCV 89.3  78.0 - 100.0 fL   MCH 31.1  26.0 - 34.0 pg   MCHC 34.8  30.0 - 36.0 g/dL   RDW 40.913.4  81.111.5 - 91.415.5 %   Platelets 177  150 - 400 K/uL  GLUCOSE, CAPILLARY   Collection Time    12/09/13  9:11 AM      Result Value Ref Range    Glucose-Capillary 65 (*) 70 - 99 mg/dL    Assessment: Natasha HillockRosa Hendrix Hendrix is a 33 y.o. G4P3003 at 3871w6d here for PROM and also with a hx of A2DM   #IOL: unfavorable at this time. Will start with cytotec and then plan for pit.  #elevated BP: initial elevated BP but all subsequent WNL. Will cont to monitor and if another elevated presents, plan to send r/o labs #Pain: IV pain meds prn #FWB:  cat I tracing. EFW 7lb13oz on 7/2. Leopolds of 7.5lbs  #ID:  GBS neg #MOF: breast #MOC:nexplanon #Circ:  declines  Isolde Skaff L 12/09/2013, 9:34 AM

## 2013-12-09 NOTE — Anesthesia Preprocedure Evaluation (Signed)
Anesthesia Evaluation  Patient identified by MRN, date of birth, ID band Patient awake    Reviewed: Allergy & Precautions, H&P , NPO status , Patient's Chart, lab work & pertinent test results, reviewed documented beta blocker date and time   History of Anesthesia Complications Negative for: history of anesthetic complications  Airway Mallampati: III TM Distance: >3 FB Neck ROM: full    Dental  (+) Teeth Intact   Pulmonary neg pulmonary ROS,  breath sounds clear to auscultation        Cardiovascular hypertension, Rhythm:regular Rate:Normal     Neuro/Psych negative neurological ROS  negative psych ROS   GI/Hepatic negative GI ROS, Neg liver ROS,   Endo/Other  diabetes, Gestational, Oral Hypoglycemic Agents  Renal/GU negative Renal ROS     Musculoskeletal   Abdominal   Peds  Hematology negative hematology ROS (+)   Anesthesia Other Findings   Reproductive/Obstetrics (+) Pregnancy                           Anesthesia Physical Anesthesia Plan  ASA: II  Anesthesia Plan: Epidural   Post-op Pain Management:    Induction:   Airway Management Planned:   Additional Equipment:   Intra-op Plan:   Post-operative Plan:   Informed Consent: I have reviewed the patients History and Physical, chart, labs and discussed the procedure including the risks, benefits and alternatives for the proposed anesthesia with the patient or authorized representative who has indicated his/her understanding and acceptance.     Plan Discussed with:   Anesthesia Plan Comments:         Anesthesia Quick Evaluation

## 2013-12-09 NOTE — Progress Notes (Signed)
Natasha Hendrix is a 33 y.o. [phone] at 6526w6d admitted forPROM  Subjective:  Doing well. Having pain that is worsening. Has used fentanyl x1. +FM.   Objective: BP 144/87  Pulse 66  Temp(Src) 98.1 F (36.7 C) (Oral)  Resp 18  Ht 5' (1.524 m)  Wt 69.4 kg (153 lb)  BMI 29.88 kg/m2  LMP 03/12/2013     FHT:  FHR: 130 bpm, variability: moderate,  accelerations:  Present,  decelerations:  Absent UC:   regular, every 2-3 minutes SVE:   Dilation: 5 Effacement (%): 90 Station: -2 Exam by:: Hendrix. MCDaniel RN  Labs: Lab Results  Component Value Date   WBC 9.9 12/09/2013   HGB 14.0 12/09/2013   HCT 38.7 12/09/2013   MCV 89.8 12/09/2013   PLT 164 12/09/2013    Assessment / Plan: Induction of labor due to PROM,  progressing well on pitocin  Labor: Progressing normally Elevated BP:  R/o labs sent and normal. Prot/cr ratio of 0.21. Cont to monitor. No severe range, no symptoms.  Fetal Wellbeing:  Category I Pain Control:  Fentanyl I/D:  n/a Anticipated MOD:  NSVD  Natasha Hendrix 12/09/2013, 6:51 PM

## 2013-12-10 LAB — GLUCOSE, CAPILLARY: Glucose-Capillary: 110 mg/dL — ABNORMAL HIGH (ref 70–99)

## 2013-12-10 MED ORDER — SENNOSIDES-DOCUSATE SODIUM 8.6-50 MG PO TABS
2.0000 | ORAL_TABLET | ORAL | Status: DC
  Administered 2013-12-10 (×2): 2 via ORAL
  Filled 2013-12-10 (×2): qty 2

## 2013-12-10 MED ORDER — LANOLIN HYDROUS EX OINT: TOPICAL_OINTMENT | CUTANEOUS | Status: DC | PRN

## 2013-12-10 MED ORDER — OXYCODONE-ACETAMINOPHEN 5-325 MG PO TABS
1.0000 | ORAL_TABLET | ORAL | Status: DC | PRN
  Administered 2013-12-10 (×2): 1 via ORAL
  Administered 2013-12-10: 2 via ORAL
  Filled 2013-12-10 (×2): qty 1
  Filled 2013-12-10: qty 2

## 2013-12-10 MED ORDER — DIPHENHYDRAMINE HCL 25 MG PO CAPS: 25.0000 mg | ORAL_CAPSULE | Freq: Four times a day (QID) | ORAL | Status: DC | PRN

## 2013-12-10 MED ORDER — ONDANSETRON HCL 4 MG PO TABS: 4.0000 mg | ORAL_TABLET | ORAL | Status: DC | PRN

## 2013-12-10 MED ORDER — ZOLPIDEM TARTRATE 5 MG PO TABS: 5.0000 mg | ORAL_TABLET | Freq: Every evening | ORAL | Status: DC | PRN

## 2013-12-10 MED ORDER — PRENATAL MULTIVITAMIN CH
1.0000 | ORAL_TABLET | Freq: Every day | ORAL | Status: DC
  Administered 2013-12-10 – 2013-12-11 (×2): 1 via ORAL
  Filled 2013-12-10 (×2): qty 1

## 2013-12-10 MED ORDER — IBUPROFEN 600 MG PO TABS
600.0000 mg | ORAL_TABLET | Freq: Four times a day (QID) | ORAL | Status: DC
  Administered 2013-12-10 – 2013-12-11 (×6): 600 mg via ORAL
  Filled 2013-12-10 (×7): qty 1

## 2013-12-10 MED ORDER — TETANUS-DIPHTH-ACELL PERTUSSIS 5-2.5-18.5 LF-MCG/0.5 IM SUSP: 0.5000 mL | Freq: Once | INTRAMUSCULAR | Status: DC

## 2013-12-10 MED ORDER — ONDANSETRON HCL 4 MG/2ML IJ SOLN: 4.0000 mg | INTRAMUSCULAR | Status: DC | PRN

## 2013-12-10 MED ORDER — DIBUCAINE 1 % RE OINT: 1.0000 "application " | TOPICAL_OINTMENT | RECTAL | Status: DC | PRN

## 2013-12-10 MED ORDER — SIMETHICONE 80 MG PO CHEW: 80.0000 mg | CHEWABLE_TABLET | ORAL | Status: DC | PRN

## 2013-12-10 MED ORDER — BENZOCAINE-MENTHOL 20-0.5 % EX AERO
1.0000 "application " | INHALATION_SPRAY | CUTANEOUS | Status: DC | PRN
  Filled 2013-12-10: qty 56

## 2013-12-10 MED ORDER — WITCH HAZEL-GLYCERIN EX PADS
1.0000 | MEDICATED_PAD | CUTANEOUS | Status: DC | PRN
Start: 2013-12-10 — End: 2013-12-11

## 2013-12-10 NOTE — Anesthesia Postprocedure Evaluation (Signed)
Anesthesia Post Note  Patient: Natasha Hendrix  Procedure(s) Performed: * No procedures listed *  Anesthesia type: Epidural  Patient location: Mother/Baby  Post pain: Pain level controlled  Post assessment: Post-op Vital signs reviewed  Last Vitals:  Filed Vitals:   12/10/13 0615  BP: 131/74  Pulse: 93  Temp: 37.1 C  Resp: 20    Post vital signs: Reviewed  Level of consciousness: awake  Complications: No apparent anesthesia complications

## 2013-12-10 NOTE — Progress Notes (Signed)
Post Partum Day 1 SVD@2203  complicated by A2DM Subjective: no complaints, up ad lib, voiding, tolerating PO, + flatus and Feeling tired, baby latching on   Objective: Blood pressure 131/74, pulse 93, temperature 98.7 F (37.1 C), temperature source Oral, resp. rate 20, height 5' (1.524 m), weight 69.4 kg (153 lb), last menstrual period 03/12/2013, SpO2 98.00%, unknown if currently breastfeeding.  Physical Exam:  General: alert, cooperative and no distress Lochia: appropriate Uterine Fundus: Firm Incision: N/A DVT Evaluation: No evidence of DVT seen on physical exam. No cords or calf tenderness.   Recent Labs  12/09/13 0755 12/09/13 1717  HGB 13.7 14.0  HCT 39.4 38.7    Assessment/Plan: Plan for discharge tomorrow, Breastfeeding, Lactation consult and Contraception Nexplanon - POCT Glucose today.    LOS: 1 day   Rodrigus Kilker G 12/10/2013, 7:51 AM

## 2013-12-10 NOTE — Progress Notes (Signed)
Pt needs to void, assisted onto bedpan.  Unable to void on bedpan after a few minutes.  Pt told bladder to be emptied via straight catheterization, understanding verbalized.  Nurse removed sheet to perform straight cath and fetal head delivering spontaneously.  RN called for assistance & CNM notified via phone.

## 2013-12-10 NOTE — Progress Notes (Signed)
Ur chart review completed.  

## 2013-12-10 NOTE — Lactation Note (Signed)
This note was copied from the chart of Natasha Hendrix. Lactation Consultation Note Experienced BF mom of one yr. Each of her children. Corliss ParishYoungest is 6 yrs. Old. Explained BF changes d/t research in making things better. Daughter at bedside 15 yrs. Old very mature talking to mom assisting in interpreting when mom isn't communicating. Hand expression demonstrated to show colostrum, mom says "see no milk". I explain, no milk but yes colostrum, it comes first d/t its very important. Mom chooses breast/formula. Mom encouraged to feed baby 8-12 times/24 hours and with feeding cues. Mom encouraged to feed baby w/feeding cuesWH/LC brochure given w/resources, support groups and LC services, in [place]. Educated about newborn behavior. Mom encouraged to waken baby for feeds.  Patient Name: Natasha Hendrix BMWUX'[id number]'s Date: 12/10/2013 Reason for consult: Other (Comment) (charting for exclusion)   Maternal Data Formula Feeding for Exclusion: Yes Reason for exclusion: Mother's choice to formula and breast feed on admission Does the patient have breastfeeding experience prior to this delivery?: Yes  Feeding Feeding Type: Bottle Fed - Formula Nipple Type: Slow - flow Length of feed: 5 min  LATCH Score/Interventions                      Lactation Tools Discussed/Used     Consult Status      Natasha Hendrix 12/10/2013, 2:01 AM

## 2013-12-11 MED ORDER — IBUPROFEN 600 MG PO TABS: 600.0000 mg | ORAL_TABLET | Freq: Four times a day (QID) | ORAL | Status: AC

## 2013-12-11 MED ORDER — MEASLES, MUMPS & RUBELLA VAC ~~LOC~~ INJ
0.5000 mL | INJECTION | Freq: Once | SUBCUTANEOUS | Status: AC
  Administered 2013-12-11: 0.5 mL via SUBCUTANEOUS
  Filled 2013-12-11 (×2): qty 0.5

## 2013-12-11 NOTE — Discharge Instructions (Signed)

## 2013-12-11 NOTE — Discharge Summary (Signed)
Obstetric Discharge Summary Reason for Admission: rupture of membranes Prenatal Procedures: NST Intrapartum Procedures: spontaneous vaginal delivery Postpartum Procedures: none Complications-Operative and Postpartum: none Hemoglobin  Date Value Ref Range Status  12/09/2013 14.0  12.0 - 15.0 g/dL Final  2/53/66441/26/2015 03.413.1   Final     HCT  Date Value Ref Range Status  12/09/2013 38.7  36.0 - 46.0 % Final  07/02/2013 40   Final    Physical Exam:  General: alert, cooperative and no distress Lochia: appropriate Uterine Fundus: firm Incision: N/A DVT Evaluation: No evidence of DVT seen on physical exam. Negative Homan's sign. No cords or calf tenderness. No significant calf/ankle edema.  Discharge Diagnoses: NSVD  Discharge Information: Date: 12/11/2013 Activity: pelvic rest Diet: routine Medications: PNV and Ibuprofen Condition: stable Instructions: refer to practice specific booklet Discharge to: home Nexplanon for contraception  Newborn Data: Live born female  Birth Weight: 7 lb 0.3 oz (3184 g) APGAR: 9, 9  Home with mother.  Letta PateSanders, Alyssa 12/11/2013, 8:07 AM  I have seen this patient and agree with the above PA student's note.  Hospital Spanish translator used for all communication.   LEFTWICH-KIRBY, Shiya Fogelman Certified Nurse-Midwife

## 2013-12-11 NOTE — Discharge Summary (Signed)
Obstetric Discharge Summary Reason for Admission: rupture of membranes Prenatal Procedures: NST Intrapartum Procedures: spontaneous vaginal delivery Postpartum Procedures: none Complications-Operative and Postpartum: none Hemoglobin  Date Value Ref Range Status  12/09/2013 14.0  12.0 - 15.0 g/dL Final  1/61/09601/26/2015 45.413.1   Final     HCT  Date Value Ref Range Status  12/09/2013 38.7  36.0 - 46.0 % Final  07/02/2013 40   Final    Physical Exam:  General: alert, cooperative and no distress Lochia: appropriate Uterine Fundus: firm Incision: N/A DVT Evaluation: No evidence of DVT seen on physical exam. Negative Homan's sign. No cords or calf tenderness. No significant calf/ankle edema.  Discharge Diagnoses: NSVD  Discharge Information: Date: 12/11/2013 Activity: pelvic rest Diet: routine Medications: PNV and Ibuprofen Condition: stable Instructions: refer to practice specific booklet Discharge to: home Nexplanon for contraception  Newborn Data: Live born female  Birth Weight: 7 lb 0.3 oz (3184 g) APGAR: 9, 9  Home with mother.  LEFTWICH-KIRBY, Eden Rho 12/11/2013, 9:17 AM

## 2013-12-12 NOTE — Progress Notes (Signed)
I have seen and examined this patient and I agree with the above. SHAW, KIMBERLY CNM 9:24 AM 12/12/2013    

## 2013-12-17 NOTE — Discharge Summary (Signed)
Attestation of Attending Supervision of Advanced Practitioner (CNM/NP): Evaluation and management procedures were performed by the Advanced Practitioner under my supervision and collaboration.  I have reviewed the Advanced Practitioner's note and chart, and I agree with the management and plan.  HARRAWAY-SMITH, Khyler Eschmann 11:27 AM     

## 2013-12-17 NOTE — Discharge Summary (Signed)
Attestation of Attending Supervision of Advanced Practitioner (CNM/NP): Evaluation and management procedures were performed by the Advanced Practitioner under my supervision and collaboration.  I have reviewed the Advanced Practitioner's note and chart, and I agree with the management and plan.  HARRAWAY-SMITH, Sufyaan Palma 11:26 AM     

## 2013-12-31 NOTE — Progress Notes (Signed)
NST 12/06/13 reactive 

## 2014-01-21 ENCOUNTER — Encounter: Payer: Self-pay | Admitting: Obstetrics & Gynecology

## 2014-01-21 ENCOUNTER — Ambulatory Visit (INDEPENDENT_AMBULATORY_CARE_PROVIDER_SITE_OTHER): Payer: No Typology Code available for payment source | Admitting: Obstetrics & Gynecology

## 2014-01-21 MED ORDER — NORETHINDRONE 0.35 MG PO TABS: 1.0000 | ORAL_TABLET | Freq: Every day | ORAL | Status: AC

## 2014-01-21 NOTE — Patient Instructions (Signed)
Uso de los anticonceptivos orales (Oral Contraception Use) Los anticonceptivos orales (ACO) son medicamentos que se utilizan para evitar el embarazo. Su funcin es evitar que los ovarios liberen vulos. Las hormonas de los ACO tambin hacen que el moco cervical se haga ms espeso, lo que evita que el esperma ingrese al tero. Tambin hacen que la membrana que recubre internamente al tero se vuelva ms fina, lo que no permite que el huevo fertilizado se adhiera a la pared del tero. Los ACO son muy efectivos cuando se toman exactamente como se prescriben. Sin embargo, no previenen contra las enfermedades de transmisin sexual (ETS). La prctica del sexo seguro, como el uso de preservativos, junto con los ACO, ayudan a prevenir ese tipo de enfermedades. Antes de tomar ACO, debe hacerse un examen fsico y un Papanicolau. El mdico podr indicarle anlisis de sangre, si es necesario. El mdico se asegurar de que usted sea una buena candidata para usar anticonceptivos orales. Converse con su mdico acerca de los posibles efectos secundarios de los ACO que podran recetarle. Cuando se inicia el uso de ACO, se pueden tomar durante 2 a 3 meses para que el cuerpo se adapte a los cambios en los niveles hormonales en el cuerpo.  CMO TOMAR LOS ANTICONCEPTIVOS ORALES El mdico le indicar como comenzar a tomar el primer ciclo de ACO. De lo contrario usted puede:   Comenzar el da de inicio del ciclo menstrual. No necesitar proteccin anticonceptiva adicional al comenzar en este momento.   Comenzar el primer domingo luego de su perodo menstrual, o el da en que adquiere el medicamento. En estos casos deber tener proteccin anticonceptiva adicional durante los primeros 7 das del ciclo.   Comenzar a tomarlos en cualquier momento del ciclo. Si toma el anticonceptivo dentro de los 5 das de iniciado el perodo, estar protegida de quedar embarazada inmediatamente. En este caso, no necesitar una forma adicional de  anticonceptivos. Si comienza en cualquier otro momento del ciclo menstrual, necesitar usar otra forma de anticonceptivo durante 7 das. Si sus ACO son del tipo de los llamados minipldoras, podrn impedir el embarazo despus de tomarlas por 2 das (48 horas). Luego de comenzar a tomar los ACO:   Si olvid de tomar 1 pldora, tmela tan pronto como lo recuerde. Tome la siguiente pldora a la hora habitual.   Si dej de tomar 2 o ms pldoras, comunquese con su mdico ya que diferentes pldoras tienen diferentes instrucciones para las dosis que no se han tomado. Si olvida tomar 2 o ms pldoras, utilice un mtodo anticonceptivo adicional hasta que comience su prximo perodo menstrual.   Si utiliza el envase de 28 pldoras que contienen pldoras inactivas y olvida tomar 1 de las ltimas 7 (pldoras sin hormonas), sto no tiene importancia. Simplemente deseche el resto de las pldoras que no contienen hormonas y comience un nuevo envase.  No importa cuando comience a tomar los anticonceptivos, siempre empiece un nuevo envase el mismo da de la semana. Tenga un envase extra de ACO y use un mtodo anticonceptivo adicional para el caso en que se olvide de tomar algunas pldoras o pierda la caja.  INSTRUCCIONES PARA EL CUIDADO EN EL HOGAR   No fume.   Use siempre un condn para protegerse contra las enfermedades de transmisin sexual. Los ACO no protegen contra las enfermedades de transmisin sexual.   Use un almanaque para marcar los das de su perodo menstrual.   Lea la informacin y consejos que vienen con las ACO. Hable con   el profesional si tiene dudas.  SOLICITE ATENCIN MDICA SI:   Presenta nuseas o vmitos.   Tiene flujo o sangrado vaginal anormal.   Aparece una erupcin cutnea.   No tiene el perodo menstrual.   Pierde el cabello.   Necesita tratamiento por cambios en su estado de nimo o por depresin.   Se siente mareada al tomar los ACO.   Comienza a  aparecer acn con el uso de los ACO.   Queda embarazada.  SOLICITE ATENCIN MDICA DE INMEDIATO SI:   Siente dolor en el pecho.   Le falta el aire.   Le duele mucho la cabeza y no puede controlar el dolor.   Siente adormecimiento o tiene dificultad para hablar.   Tiene problemas de visin.   Presenta dolor, inflamacin o hinchazn en las piernas.  Document Released: 05/13/2011 Document Revised: 01/24/2013 [employer] Patient Information 2015 [employer]. This information is not intended to replace advice given to you by your health care provider. Make sure you discuss any questions you have with your health care provider.  

## 2014-01-21 NOTE — Progress Notes (Signed)
Patient ID: Natasha Hendrix, female   DOB: 03/07/1981,Natasha Hendrix 33 y.o.   MRN: [id number] Subjective:     [name] is a 33 y.o. female who presents for a postpartum visit. She is 6 weeks postpartum following a spontaneous vaginal delivery. I have fully reviewed the prenatal and intrapartum course. The delivery was at 39 gestational weeks. Outcome: spontaneous vaginal delivery. Anesthesia: epidural. Postpartum course has been uncomplicated. Baby's course has been without problems. Baby is feeding by breast. Bleeding no bleeding. Bowel function is normal. Bladder function is normal. Patient is not sexually active. Contraception method is abstinence. Postpartum depression screening: negative.  The following portions of the patient's history were reviewed and updated as appropriate: allergies, current medications, past family history, past medical history, past social history, past surgical history and problem list.  Review of Systems A comprehensive review of systems was negative.   Objective:    BP 132/76  Pulse 96  Temp(Src) 98.2 F (36.8 C) (Oral)  Wt 135 lb 11.2 oz (61.553 kg)  Breastfeeding? Yes  General:  alert           Abdomen: soft, non-tender; bowel sounds normal; no masses,  no organomegaly                 Rectal Exam: Not performed.        Assessment:     6 week postpartum exam. Pap smear not done at today's visit.   Plan:    1. Contraception: micronor 2. F/u at the HD for Nexplanon 3. Follow up in: 2 week or as needed.  Needs to f/u for 2 hour GTT

## 2014-01-21 NOTE — Progress Notes (Signed)
Patient here today for PP visit. Patient wishes to have Nexplanon-- does not have insurance-- discussed going to health department for discounted insertion and device. Patient verbalized understanding.

## 2014-01-25 ENCOUNTER — Other Ambulatory Visit: Payer: Medicaid Other

## 2014-01-25 DIAGNOSIS — R7309 Other abnormal glucose: Secondary | ICD-10-CM

## 2014-01-25 LAB — GLUCOSE TOLERANCE, 2 HOURS
Glucose, 2 hour: 167 mg/dL — ABNORMAL HIGH (ref 70–139)
Glucose, Fasting: 80 mg/dL (ref 70–99)

## 2014-01-29 ENCOUNTER — Telehealth: Payer: Self-pay

## 2014-01-29 DIAGNOSIS — O24419 Gestational diabetes mellitus in pregnancy, unspecified control: Secondary | ICD-10-CM

## 2014-01-29 NOTE — Telephone Encounter (Signed)
Called patient with pacific interpreter [phone]. Informed patient of the results and the need to and importance of following up with PCP for management. Patient does not have a PCP but states she has gone to community [facility]-- informed her I would refer her through our computer system to [facility]. Gave patient number [phone] to call and schedule an appointment. Advised patient call clinic with any questions or concerns but to ensure she sees some PCP for management. Patient verbalized understanding and gratitude, states she will call [facility]. No questions or concerns.

## 2014-01-29 NOTE — Telephone Encounter (Signed)
Message copied by Louanna Raw on Tue Jan 29, 2014  8:41 AM ------      Message from: Adam Phenix      Created: Mon Jan 28, 2014  2:28 PM       Impaired glucose tolerance needs primary care follow up ------

## 2014-03-14 ENCOUNTER — Other Ambulatory Visit: Payer: Self-pay | Admitting: Home Health Services

## 2014-03-14 DIAGNOSIS — O24419 Gestational diabetes mellitus in pregnancy, unspecified control: Secondary | ICD-10-CM

## 2014-04-08 ENCOUNTER — Encounter: Payer: Self-pay | Admitting: Obstetrics & Gynecology

## 2014-05-10 ENCOUNTER — Encounter: Payer: Self-pay | Admitting: Obstetrics & Gynecology

## 2014-05-21 ENCOUNTER — Encounter: Payer: Self-pay | Admitting: *Deleted

## 2014-08-27 IMAGING — US US OB FOLLOW-UP
1 series · 12 of 28 positions shown · non-contrast
Comparison: none

[Series 1: us ob follow up · 51 acquisitions, 12 frames shown]
[im 2/51]
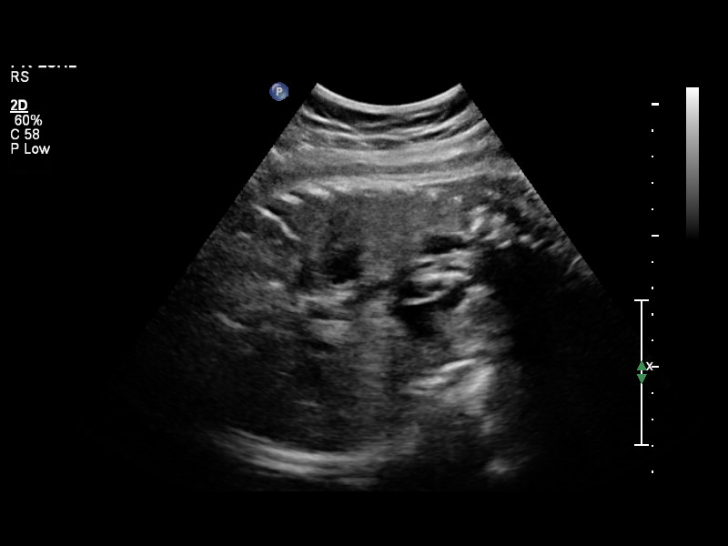
[im 6/51]
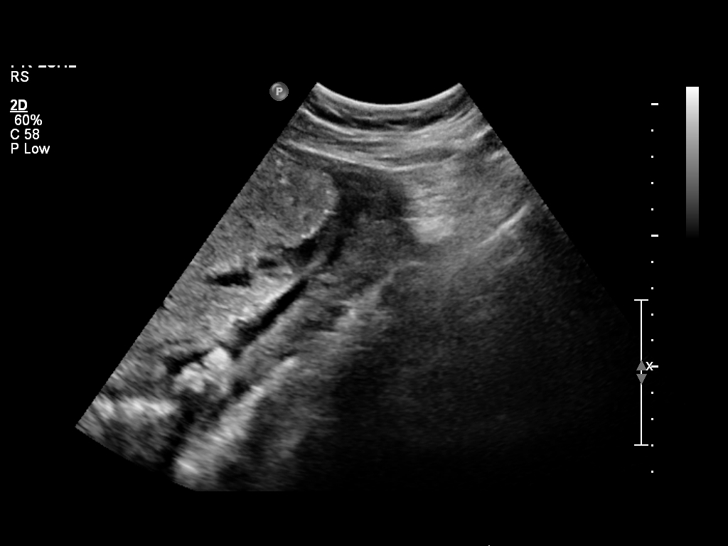
[im 10/51]
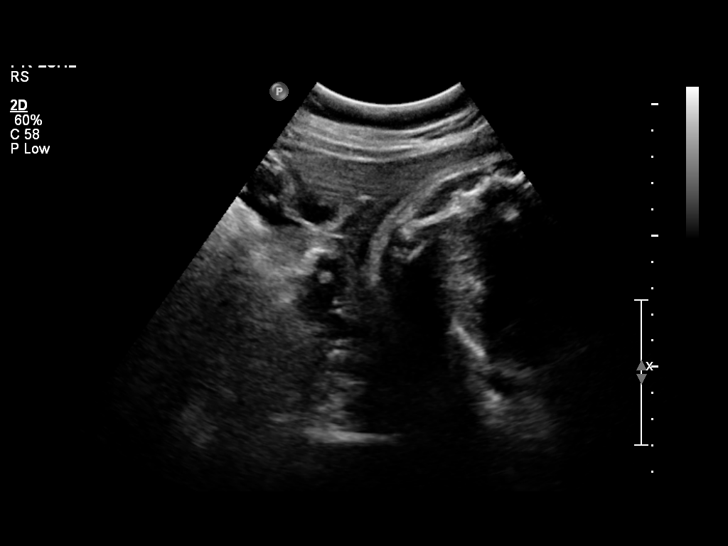
[im 15/51]
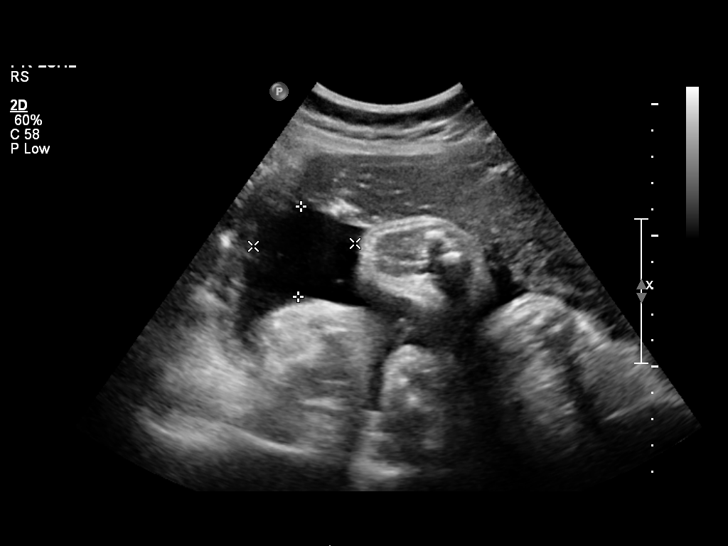
[im 19/51]
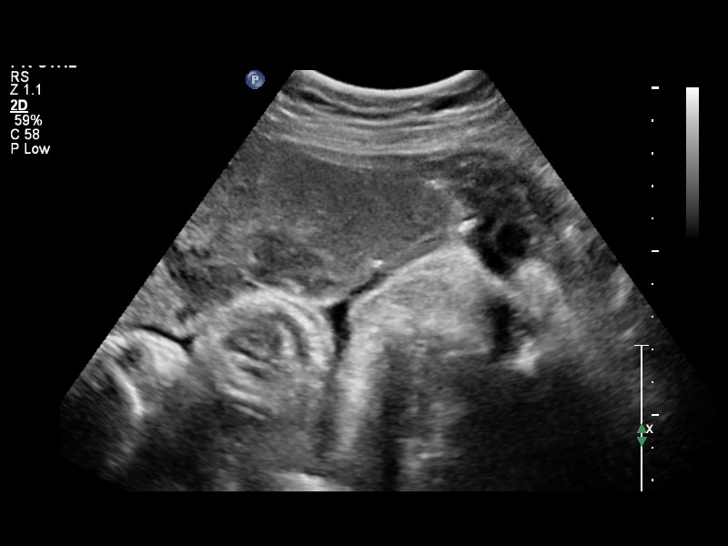
[im 23/51]
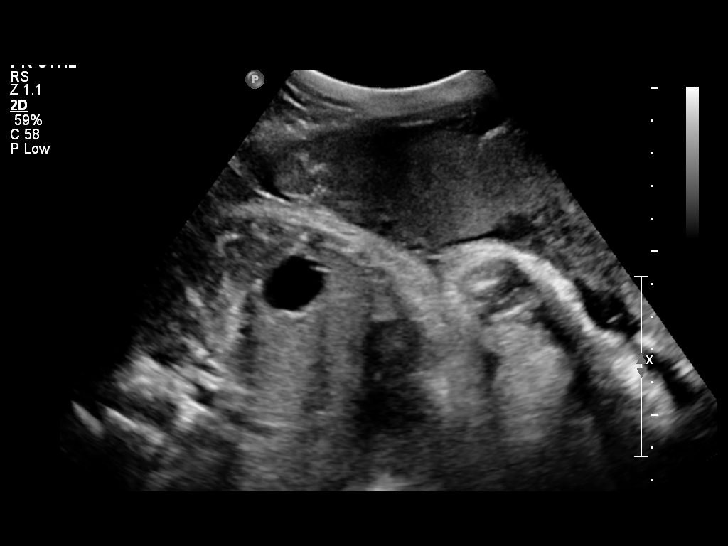
[im 28/51]
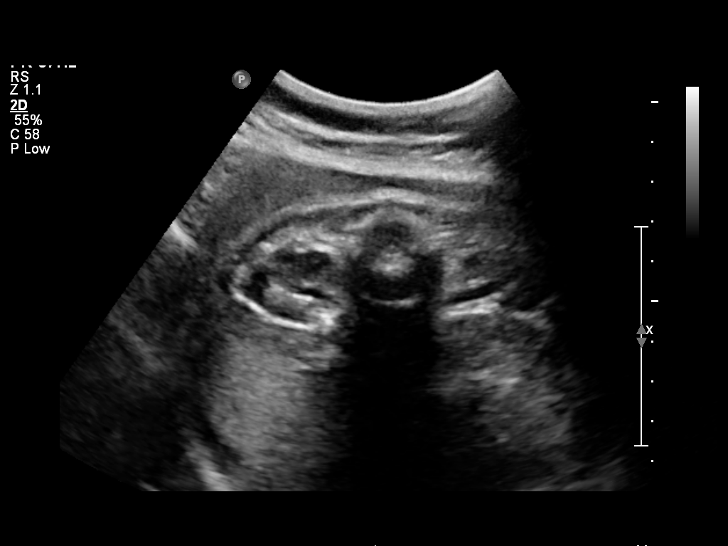
[im 32/51]
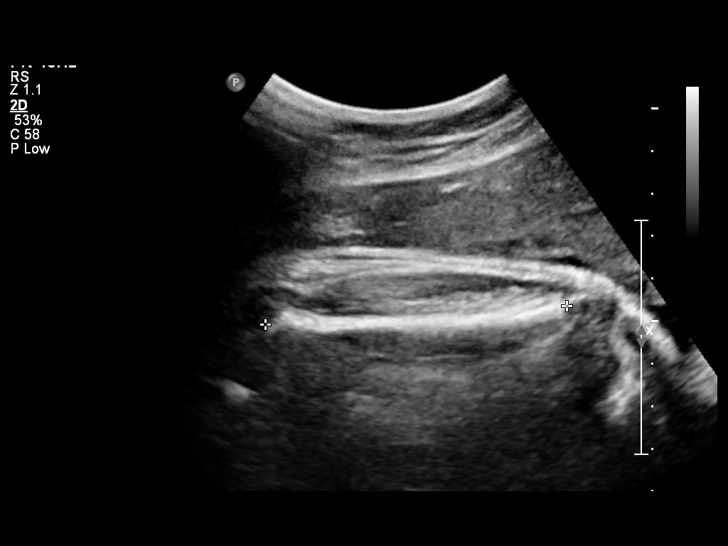
[im 36/51]
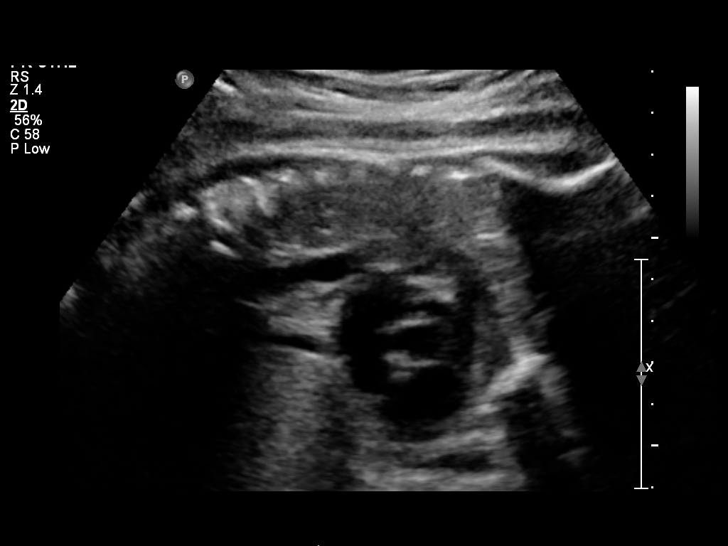
[im 41/51]
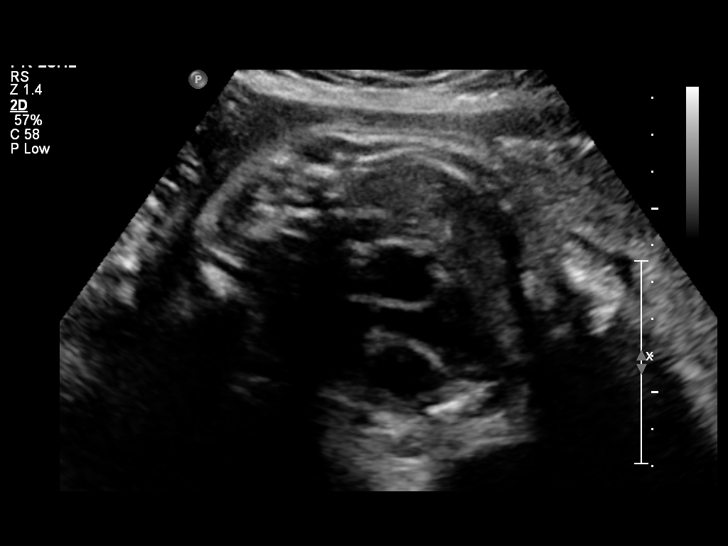
[im 45/51]
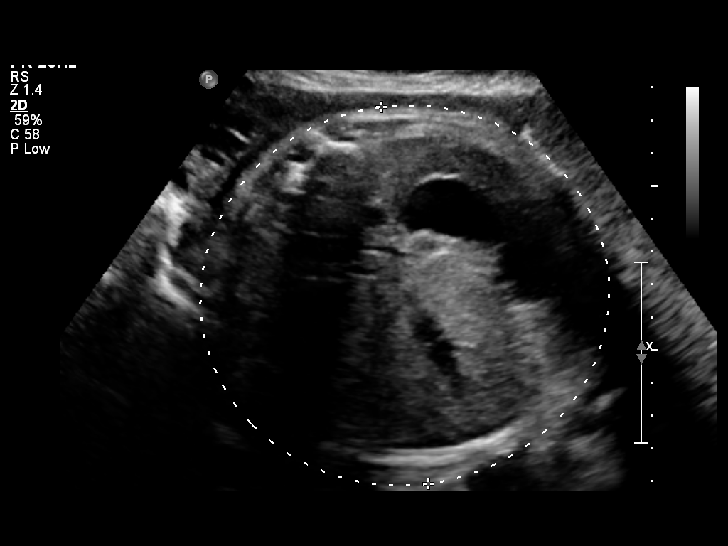
[im 49/51]
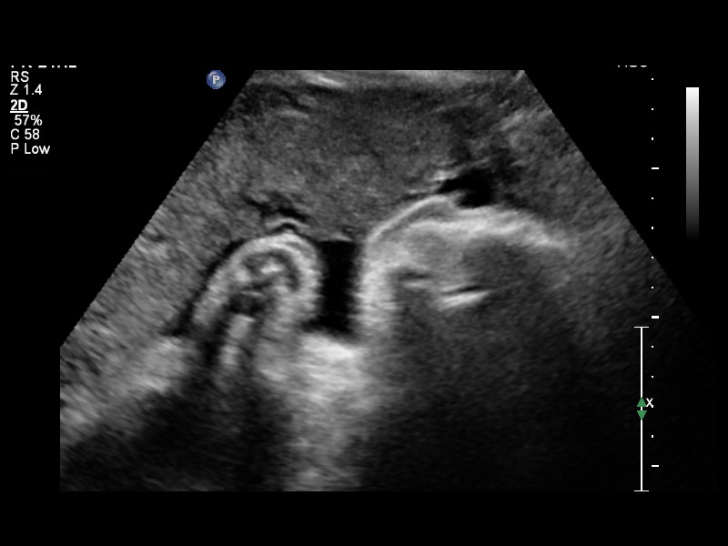

[12 of 28 positions shown; findings below may reference images not displayed]

OBSTETRICS REPORT
                      (Signed Final 12/06/2013 [DATE])

Service(s) Provided

 US OB FOLLOW UP                                       76816.1
Indications

 Diabetes - Gestational, A2 or type 2 DM on
 glyburide
Fetal Evaluation

 Num Of Fetuses:    1
 Fetal Heart Rate:  142                          bpm
 Cardiac Activity:  Observed
 Presentation:      Cephalic
 Placenta:          Anterior, above cervical os
 P. Cord            Previously Visualized
 Insertion:

 Amniotic Fluid
 AFI FV:      Subjectively low-normal
 AFI Sum:     7.86    cm       10  %Tile     Larg Pckt:    3.43  cm
 RLQ:   2.7     cm   LUQ:    3.43   cm    LLQ:   1.73    cm
Biometry

 BPD:     89.9  mm     G. Age:  36w 3d                CI:        74.89   70 - 86
                                                      FL/HC:      21.5   20.9 -

 HC:     329.6  mm     G. Age:  37w 4d       16  %    HC/AC:      0.91   0.92 -

 AC:     362.4  mm     G. Age:  40w 1d       96  %    FL/BPD:     78.9   71 - 87
 FL:      70.9  mm     G. Age:  36w 2d       10  %    FL/AC:      19.6   20 - 24

 Est. FW:    5558  gm    7 lb 13 oz      83  %
Gestational Age

 LMP:           38w 3d        Date:  03/12/13                 EDD:   12/17/13
 U/S Today:     37w 4d                                        EDD:   12/23/13
 Best:          38w 3d     Det. By:  LMP  (03/12/13)          EDD:   12/17/13
Anatomy

 Cranium:          Appears normal         Aortic Arch:      Appears normal
 Fetal Cavum:      Previously seen        Ductal Arch:      Appears normal
 Ventricles:       Appears normal         Diaphragm:        Previously seen
 Choroid Plexus:   Previously seen        Stomach:          Appears normal, left
                                                            sided
 Cerebellum:       Previously seen        Abdomen:          Appears normal
 Posterior Fossa:  Previously seen        Abdominal Wall:   Previously seen
 Nuchal Fold:      Not applicable (>20    Cord Vessels:     Previously seen
                   wks GA)
 Face:             Orbits and profile     Kidneys:          Appear normal
                   previously seen
 Lips:             Previously seen        Bladder:          Appears normal
 Heart:            Appears normal         Spine:            Previously seen
                   (4CH, axis, and
                   situs)
 RVOT:             Appears normal         Lower             Previously seen
                                          Extremities:
 LVOT:             Appears normal         Upper             Previously seen
                                          Extremities:

 Other:  Male gender.Heels and Nasal bone previously visualized. Technically
         difficult due to fetal position.
Cervix Uterus Adnexa

 Cervix:       Not visualized (advanced GA >55wks)

 Adnexa:     No abnormality visualized.
Impression

 Single IUP at 38w 3d
 A2 GDM on glyburide
 Interval fetal growth is appropriate (83rd %tile); the AC
 measures at the 96th %tile
 Amniotic fluid volume is low normal (AFI 7.8 cm)
Recommendations

 If patient reports leakage of fluid, may warrant further
 evaluation given low normal amniotic fluid volume.
 Recommend continued antepartum fetal testing as scheduled
 Recommend delivery at 39 weeks due to A2 GDM in the
 absence of other complications

 questions or concerns.
